# Patient Record
Sex: Female | Born: 1950 | Race: White | Hispanic: No | Marital: Married | Smoking: Former smoker
Health system: Southern US, Community
[De-identification: ages and names within clinical notes are randomized; demographics above are authoritative.]

## PROBLEM LIST (undated history)

## (undated) DIAGNOSIS — J449 Chronic obstructive pulmonary disease, unspecified: Secondary | ICD-10-CM

## (undated) DIAGNOSIS — Z720 Tobacco use: Secondary | ICD-10-CM

## (undated) DIAGNOSIS — Z889 Allergy status to unspecified drugs, medicaments and biological substances status: Secondary | ICD-10-CM

## (undated) DIAGNOSIS — Z801 Family history of malignant neoplasm of trachea, bronchus and lung: Secondary | ICD-10-CM

## (undated) DIAGNOSIS — Z86718 Personal history of other venous thrombosis and embolism: Secondary | ICD-10-CM

## (undated) DIAGNOSIS — Z87898 Personal history of other specified conditions: Secondary | ICD-10-CM

## (undated) DIAGNOSIS — N809 Endometriosis, unspecified: Secondary | ICD-10-CM

## (undated) HISTORY — PX: SKIN LESION EXCISION: SHX2412

## (undated) HISTORY — DX: Allergy status to unspecified drugs, medicaments and biological substances: Z88.9

## (undated) HISTORY — PX: TUBAL LIGATION: SHX77

## (undated) HISTORY — DX: Personal history of other specified conditions: Z87.898

## (undated) HISTORY — PX: TUMOR REMOVAL: SHX12

## (undated) HISTORY — DX: Personal history of other venous thrombosis and embolism: Z86.718

## (undated) HISTORY — DX: Chronic obstructive pulmonary disease, unspecified: J44.9

## (undated) HISTORY — DX: Tobacco use: Z72.0

## (undated) HISTORY — DX: Family history of malignant neoplasm of trachea, bronchus and lung: Z80.1

## (undated) HISTORY — PX: ABLATION ON ENDOMETRIOSIS: SHX5787

## (undated) HISTORY — DX: Endometriosis, unspecified: N80.9

---

## 1990-02-24 HISTORY — PX: TOTAL ABDOMINAL HYSTERECTOMY: SHX209

## 2001-05-23 ENCOUNTER — Other Ambulatory Visit: Admission: RE | Admit: 2001-05-23 | Discharge: 2001-05-23 | Payer: Self-pay | Admitting: Obstetrics and Gynecology

## 2001-06-10 ENCOUNTER — Encounter: Payer: Self-pay | Admitting: Obstetrics and Gynecology

## 2001-06-10 ENCOUNTER — Encounter: Admission: RE | Admit: 2001-06-10 | Discharge: 2001-06-10 | Payer: Self-pay | Admitting: Obstetrics and Gynecology

## 2002-06-15 ENCOUNTER — Other Ambulatory Visit: Admission: RE | Admit: 2002-06-15 | Discharge: 2002-06-15 | Payer: Self-pay | Admitting: Gynecology

## 2003-06-21 ENCOUNTER — Other Ambulatory Visit: Admission: RE | Admit: 2003-06-21 | Discharge: 2003-06-21 | Payer: Self-pay | Admitting: Gynecology

## 2004-06-21 ENCOUNTER — Other Ambulatory Visit: Admission: RE | Admit: 2004-06-21 | Discharge: 2004-06-21 | Payer: Self-pay | Admitting: Gynecology

## 2005-06-25 ENCOUNTER — Other Ambulatory Visit: Admission: RE | Admit: 2005-06-25 | Discharge: 2005-06-25 | Payer: Self-pay | Admitting: Gynecology

## 2006-08-07 ENCOUNTER — Other Ambulatory Visit: Admission: RE | Admit: 2006-08-07 | Discharge: 2006-08-07 | Payer: Self-pay | Admitting: Gynecology

## 2011-05-21 ENCOUNTER — Encounter: Payer: Self-pay | Admitting: Internal Medicine

## 2011-05-21 ENCOUNTER — Institutional Professional Consult (permissible substitution): Payer: Self-pay | Admitting: Internal Medicine

## 2011-05-22 ENCOUNTER — Encounter: Payer: Self-pay | Admitting: Internal Medicine

## 2011-05-22 ENCOUNTER — Ambulatory Visit (INDEPENDENT_AMBULATORY_CARE_PROVIDER_SITE_OTHER): Payer: BC Managed Care – PPO | Admitting: Internal Medicine

## 2011-05-22 VITALS — BP 116/68 | HR 70 | Temp 98.9°F | Ht 60.0 in | Wt 89.0 lb

## 2011-05-22 DIAGNOSIS — J449 Chronic obstructive pulmonary disease, unspecified: Secondary | ICD-10-CM | POA: Insufficient documentation

## 2011-05-22 NOTE — Progress Notes (Signed)
  Subjective:    Patient ID: Catherine Jensen, female    DOB: 19-Aug-1951, 60 y.o.   MRN: [id number]  HPI  52 yowf quit smoking 2010 due to wellness issues on work insurance with recurrent pattern of "bronchitis" going back to around 2007 and between feels pretty good x when tries to walk up hills but able to play golf all day. No change in pattern of "bronchitis" since quit smoking  05/22/2011 Initial pulmonary office eval cc recurrent cough and persistent minimally worsening  doe x hills only x 2 years. No present cough. Usually episodes occur 2-3 x per year "with the season changes, like when the heat in the house comes on".       Review of Systems  Constitutional: Negative for fever and unexpected weight change.  HENT: Positive for sore throat. Negative for ear pain, nosebleeds, congestion, rhinorrhea, sneezing, trouble swallowing, dental problem, postnasal drip and sinus pressure.   Eyes: Negative for redness and itching.  Respiratory: Positive for shortness of breath. Negative for cough, chest tightness and wheezing.   Cardiovascular: Negative for palpitations and leg swelling.  Gastrointestinal: Negative for nausea and vomiting.  Genitourinary: Negative for dysuria.  Musculoskeletal: Negative for joint swelling.  Skin: Negative for rash.  Neurological: Negative for headaches.  Hematological: Does not bruise/bleed easily.  Psychiatric/Behavioral: Positive for dysphoric mood. The patient is not nervous/anxious.        Objective:   Physical Exam    thin very anxious wf nad Wt 89 lb  05/22/2011  HEENT mild turbinate edema.  Oropharynx no thrush or excess pnd or cobblestoning.  No JVD or cervical adenopathy. Mild accessory muscle hypertrophy. Trachea midline, nl thryroid. Chest was hyperinflated by percussion with diminished breath sounds and moderate increased exp time without wheeze. Hoover sign positive at mid inspiration. Regular rate and rhythm without murmur gallop or rub or increase  P2 or edema.  Abd: no hsm, nl excursion. Ext warm without cyanosis or clubbing.      Assessment & Plan:

## 2011-05-22 NOTE — Patient Instructions (Addendum)
Maintaining off cigarettes is the best way to preserve your lung function and avoid conditions that bother your breathing  If you feel like you losing ground with your exercise we definitely need to see you in the [place] office with PFT's same day  I would recommend you receive you get a pneumonia shot now and at age 60 years and yearly influenza shot and a full set of PFT's (if you want any or all of these done we will need to see you in our [place] office,  Call [phone] to schedule a visit at your convenience)

## 2011-05-22 NOTE — Assessment & Plan Note (Signed)
Pattern clinically is of very mild bronchitis intermittently with no def chronic bronchitis or convincing symptoms of chronic airflow obstruction and she is not interested in any form of maint rx  I reviewed the Flethcher curve with patient that basically indicates  if you quit smoking when your best day FEV1 is still well preserved it is highly unlikely you will progress to severe disease and informed the patient there was no medication on the market that has proven to change the curve or the likelihood of progression.  Therefore stopping smoking and maintaining abstinence is the most important aspect of care, not choice of inhalers or for that matter, doctors.

## 2011-06-04 ENCOUNTER — Institutional Professional Consult (permissible substitution): Payer: BC Managed Care – PPO | Admitting: Internal Medicine

## 2011-06-21 ENCOUNTER — Institutional Professional Consult (permissible substitution): Payer: Self-pay | Admitting: Internal Medicine

## 2013-09-14 ENCOUNTER — Encounter (INDEPENDENT_AMBULATORY_CARE_PROVIDER_SITE_OTHER): Payer: Self-pay

## 2013-09-14 ENCOUNTER — Ambulatory Visit: Payer: BC Managed Care – PPO

## 2013-09-14 DIAGNOSIS — Z23 Encounter for immunization: Secondary | ICD-10-CM

## 2014-01-07 ENCOUNTER — Telehealth: Payer: Self-pay | Admitting: Family Medicine

## 2014-01-07 NOTE — Telephone Encounter (Signed)
Last vomited 30 min ago. Last episode of diarrhea was several hours ago. Symptoms began 2 days ago and have improved. She isn't sure if she has a fever.  Suggested she let her stomach rest for an hour. If no vomiting in an hour she can eat ice chips and progress to clear liquids slowly. No caffeine or dairy products. Immodium OTC in limited amounts. Emetrol OTC for nausea. Taken orally this may cause vomiting though. Patient is to go to the ED if her symptoms worsen during the night. She will need to be seen in our office tomorrow if symptoms do not improve. Patient stated understanding and agreement to plan.

## 2014-04-08 ENCOUNTER — Encounter: Payer: Self-pay | Admitting: Certified Nurse Midwife

## 2014-04-08 ENCOUNTER — Ambulatory Visit (INDEPENDENT_AMBULATORY_CARE_PROVIDER_SITE_OTHER): Payer: BC Managed Care – PPO | Admitting: Certified Nurse Midwife

## 2014-04-08 VITALS — BP 120/74 | HR 68 | Resp 16 | Ht 59.25 in | Wt 90.0 lb

## 2014-04-08 DIAGNOSIS — Z0000 Encounter for general adult medical examination without abnormal findings: Secondary | ICD-10-CM

## 2014-04-08 DIAGNOSIS — Z86718 Personal history of other venous thrombosis and embolism: Secondary | ICD-10-CM | POA: Insufficient documentation

## 2014-04-08 DIAGNOSIS — R319 Hematuria, unspecified: Secondary | ICD-10-CM

## 2014-04-08 DIAGNOSIS — Z01419 Encounter for gynecological examination (general) (routine) without abnormal findings: Secondary | ICD-10-CM

## 2014-04-08 LAB — POCT URINALYSIS DIPSTICK
Bilirubin, UA: NEGATIVE
Glucose, UA: NEGATIVE
Ketones, UA: NEGATIVE
Leukocytes, UA: NEGATIVE
Nitrite, UA: NEGATIVE
Protein, UA: NEGATIVE
Urobilinogen, UA: NEGATIVE
pH, UA: 5

## 2014-04-08 NOTE — Patient Instructions (Addendum)

## 2014-04-08 NOTE — Progress Notes (Signed)
63 y.o. G63P1001 Married Caucasian Fe here to establish gyn care and  for annual exam. Menopausal no HRT. Denies vaginal bleeding or vaginal dryness. 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 Sees PCP for aex yearly and labs with job yearly. Copy here, all normal. Denies any urinary symptoms or vaginal symptoms. Not sexually active in years. No other health issues today.  Patient's last menstrual period was 02/24/1990.          Sexually active: no  The current method of family planning is status post hysterectomy.    Exercising: yes  work,play golf, yardwork Smoker:  no  Health Maintenance: Pap:  2013 negative(vaginal) MMG:  2014 negative Colonoscopy:  2011 negative 10 years BMD:  2011, Osteopenia TDaP: unsure date with PCP Labs: Poct urine-rbc-tr Self breast exam: done monthly   reports that she quit smoking about 5 years ago. Her smoking use included Cigarettes. She has a 10.5 pack-year smoking history. She does not have any smokeless tobacco history on file. She reports that she does not drink alcohol or use illicit drugs.  Past Medical History  Diagnosis Date  . COPD (chronic obstructive pulmonary disease)   . FH: lung cancer   . Tobacco abuse   . Multiple allergies     pt states she took allergy shots x 20 yrs  . Endometriosis   . H/O blood clots     in legs    Past Surgical History  Procedure Laterality Date  . Total abdominal hysterectomy  1996  . Tumor removal    . Tubal ligation    . Skin lesion excision      Current Outpatient Prescriptions  Medication Sig Dispense Refill  . ASPIRIN PO Take by mouth. 2 daily      . BIOTIN PO Take by mouth daily.      . calcium citrate (CALCITRATE - DOSED IN MG ELEMENTAL CALCIUM) 950 MG tablet Take 1 tablet by mouth daily.        . Cholecalciferol (VITAMIN D) 1000 UNITS capsule Take 1,000  Units by mouth daily.        . Multiple Vitamins-Minerals (MULTIVITAMIN WITH MINERALS) tablet Take 1 tablet by mouth daily.         No current facility-administered medications for this visit.    Family History  Problem Relation Age of Onset  . Lung cancer Sister     w/ mets to brain  . Asthma Sister   . Crohn's disease Sister   . Heart disease Mother   . Diabetes Mother   . Heart attack Brother   . Diabetes Brother   . Kidney failure Brother     ROS:  Pertinent items are noted in HPI.  Otherwise, a comprehensive ROS was negative.  Exam:   BP 120/74  Pulse 68  Resp 16  Ht 4' 11.25" (1.505 m)  Wt 90 lb (40.824 kg)  BMI 18.02 kg/m2  LMP 02/24/1990 Height: 4' 11.25" (150.5 cm)  Ht Readings from Last 3 Encounters:  04/08/14 4' 11.25" (1.505 m)  05/22/11 5' (1.524 m)    General appearance: alert, cooperative and appears stated age Head: Normocephalic, without obvious abnormality, atraumatic Neck: no adenopathy, supple, symmetrical, trachea midline and thyroid normal to inspection and palpation and non-palpable Lungs: clear to auscultation bilaterally Breasts: normal appearance, no masses or tenderness, No nipple retraction or dimpling, No nipple  discharge or bleeding, No axillary or supraclavicular adenopathy Heart: regular rate and rhythm Abdomen: soft, non-tender; no masses,  no organomegaly Extremities: extremities normal, atraumatic, no cyanosis or edema Skin: Skin color, texture, turgor normal. No rashes or lesions Lymph nodes: Cervical, supraclavicular, and axillary nodes normal. No abnormal inguinal nodes palpated Neurologic: Grossly normal   Pelvic: External genitalia:  no lesions              Urethra:  normal appearing urethra with no masses, tenderness or lesions              Bartholin's and Skene's: normal                 Vagina: atrophic appearing vagina with normal color and discharge, no lesions, very small vaginal opening              Cervix: absent               Pap taken: no Bimanual Exam:  Uterus:  uterus absent              Adnexa: normal adnexa and no mass, fullness, tenderness               Rectovaginal: Confirms               Anus:  normal sphincter tone, no lesions  A:  Well Woman with normal exam  Menopausal no HRT 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 Patient plans to try Olive Oil, will advise if problems.  Pap smear not taken today  Continue follow up with PCP as indicated  Lab: Urine micro   counseled on breast self exam, mammography screening, osteoporosis, adequate intake of calcium and vitamin D, diet and exercise  return annually or prn  An After Visit Summary was printed and given to the patient.

## 2014-04-10 LAB — URINALYSIS, MICROSCOPIC ONLY
Bacteria, UA: NONE SEEN
Casts: NONE SEEN
Crystals: NONE SEEN
Squamous Epithelial / HPF: NONE SEEN

## 2014-04-12 NOTE — Progress Notes (Signed)
Reviewed personally.  M. Suzanne Lillar Bianca, MD.  

## 2014-09-14 ENCOUNTER — Ambulatory Visit (INDEPENDENT_AMBULATORY_CARE_PROVIDER_SITE_OTHER): Payer: BC Managed Care – PPO | Admitting: Family Medicine

## 2014-09-14 ENCOUNTER — Encounter (INDEPENDENT_AMBULATORY_CARE_PROVIDER_SITE_OTHER): Payer: Self-pay

## 2014-09-14 VITALS — BP 154/85 | HR 76 | Temp 98.0°F | Ht 59.0 in | Wt 91.6 lb

## 2014-09-14 DIAGNOSIS — J069 Acute upper respiratory infection, unspecified: Secondary | ICD-10-CM

## 2014-09-14 MED ORDER — AZITHROMYCIN 250 MG PO TABS: ORAL_TABLET | ORAL | Status: DC

## 2014-09-14 NOTE — Progress Notes (Signed)
   Subjective:    Patient ID: Catherine Jensen, female    DOB: February 09, 1951, 63 y.o.   MRN: [id number]  HPI This 63 y.o. female presents for evaluation of c/o uri sx's and cough.   Review of Systems No chest pain, SOB, HA, dizziness, vision change, N/V, diarrhea, constipation, dysuria, urinary urgency or frequency, myalgias, arthralgias or rash.     Objective:   Physical Exam  Vital signs noted  Well developed well nourished female.  HEENT - Head atraumatic Normocephalic                Eyes - PERRLA, Conjuctiva - clear Sclera- Clear EOMI                Ears - EAC's Wnl TM's Wnl Gross Hearing WN                Throat - oropharanx wnl Respiratory - Lungs CTA bilateral Cardiac - RRR S1 and S2 without murmur GI - Abdomen soft Nontender and bowel sounds active x 4 Extremities - No edema. Neuro - Grossly intact.      Assessment & Plan:  URI (upper respiratory infection) - Plan: azithromycin (ZITHROMAX) 250 MG tablet Push po fluids, rest, tylenol and motrin otc prn as directed for fever, arthralgias, and myalgias.  Follow up prn if sx's continue or persist.  Lysbeth Penner FNP

## 2014-09-27 ENCOUNTER — Encounter: Payer: Self-pay | Admitting: Certified Nurse Midwife

## 2015-04-11 ENCOUNTER — Encounter: Payer: Self-pay | Admitting: Certified Nurse Midwife

## 2015-04-11 ENCOUNTER — Ambulatory Visit (INDEPENDENT_AMBULATORY_CARE_PROVIDER_SITE_OTHER): Payer: BLUE CROSS/BLUE SHIELD | Admitting: Certified Nurse Midwife

## 2015-04-11 VITALS — BP 102/66 | HR 70 | Resp 16 | Ht 59.0 in | Wt 90.0 lb

## 2015-04-11 DIAGNOSIS — Z01419 Encounter for gynecological examination (general) (routine) without abnormal findings: Secondary | ICD-10-CM | POA: Diagnosis not present

## 2015-04-11 DIAGNOSIS — Z0000 Encounter for general adult medical examination without abnormal findings: Secondary | ICD-10-CM

## 2015-04-11 LAB — POCT URINALYSIS DIPSTICK
Bilirubin, UA: NEGATIVE
Blood, UA: NEGATIVE
Glucose, UA: NEGATIVE
Ketones, UA: NEGATIVE
Leukocytes, UA: NEGATIVE
Nitrite, UA: NEGATIVE
Protein, UA: NEGATIVE
Urobilinogen, UA: NEGATIVE
pH, UA: 5

## 2015-04-11 NOTE — Patient Instructions (Signed)

## 2015-04-11 NOTE — Progress Notes (Signed)
64 y.o. G5P1001 Married  Caucasian Fe here for annual exam. Menopausal no HRT. Occasional hot flash, no issues. Vaginal dryness, but not sexually active. Does feel she needs moisturizer due to not being sexually active. Sees PCP once yearly and labs with work, brought lab copy with her, all normal.  Due for colonoscopy, patient will schedule. No other health issues today.  Patient's last menstrual period was 02/24/1990.          Sexually active: No.  The current method of family planning is status post hysterectomy.    Exercising: Yes.    play golf Smoker:  no  Health Maintenance: Pap:  2013 neg (vaginal) MMG:  2015 neg per patient Colonoscopy:  2011 neg f/u 21yr BMD:   2011, osteopenia TDaP:  Unsure with pcp feels she is update Labs: Poct urine-neg Self breast exam: done monthly   reports that she quit smoking about 6 years ago. Her smoking use included Cigarettes. She has a 10.5 pack-year smoking history. She has never used smokeless tobacco. She reports that she does not drink alcohol or use illicit drugs.  Past Medical History  Diagnosis Date  . COPD (chronic obstructive pulmonary disease)   . FH: lung cancer   . Tobacco abuse   . Multiple allergies     pt states she took allergy shots x 20 yrs  . Endometriosis   . H/O blood clots     in legs    Past Surgical History  Procedure Laterality Date  . Tumor removal    . Tubal ligation    . Skin lesion excision    . Total abdominal hysterectomy  4/91    Current Outpatient Prescriptions  Medication Sig Dispense Refill  . ASPIRIN PO Take by mouth. 2 daily    . BIOTIN PO Take by mouth daily.    . calcium citrate (CALCITRATE - DOSED IN MG ELEMENTAL CALCIUM) 950 MG tablet Take 1 tablet by mouth daily.      . Cholecalciferol (VITAMIN D) 1000 UNITS capsule Take 1,000 Units by mouth daily.      . Multiple Vitamins-Minerals (MULTIVITAMIN WITH MINERALS) tablet Take 1 tablet by mouth daily.       No current facility-administered  medications for this visit.    Family History  Problem Relation Age of Onset  . Lung cancer Sister     w/ mets to brain  . Asthma Sister   . Crohn's disease Sister   . Heart disease Mother   . Diabetes Mother   . Heart attack Brother   . Diabetes Brother   . Kidney failure Brother     ROS:  Pertinent items are noted in HPI.  Otherwise, a comprehensive ROS was negative.  Exam:   BP 102/66 mmHg  Pulse 70  Resp 16  Ht '4\' 11"'$  (1.499 m)  Wt 90 lb (40.824 kg)  BMI 18.17 kg/m2  LMP 02/24/1990 Height: '4\' 11"'$  (149.9 cm) Ht Readings from Last 3 Encounters:  04/11/15 '4\' 11"'$  (1.499 m)  09/14/14 '4\' 11"'$  (1.499 m)  04/08/14 4' 11.25" (1.505 m)    General appearance: alert, cooperative and appears stated age Head: Normocephalic, without obvious abnormality, atraumatic Neck: no adenopathy, supple, symmetrical, trachea midline and thyroid normal to inspection and palpation Lungs: clear to auscultation bilaterally Breasts: normal appearance, no masses or tenderness, No nipple retraction or dimpling, No nipple discharge or bleeding, No axillary or supraclavicular adenopathy Heart: regular rate and rhythm Abdomen: soft, non-tender; no masses,  no organomegaly Extremities: extremities  normal, atraumatic, no cyanosis or edema Skin: Skin color, texture, turgor normal. No rashes or lesions Lymph nodes: Cervical, supraclavicular, and axillary nodes normal. No abnormal inguinal nodes palpated Neurologic: Grossly normal   Pelvic: External genitalia:  no lesions              Urethra:  normal appearing urethra with no masses, tenderness or lesions              Bartholin's and Skene's: normal                 Vagina: normal appearing vagina with normal color and discharge, no lesions              Cervix: absent              Pap taken: No. Bimanual Exam:  Uterus:  uterus absent              Adnexa: normal adnexa and no mass, fullness, tenderness               Rectovaginal: Confirms                Anus:  normal sphincter tone, no lesions  Chaperone present: Yes  A:  Well Woman with normal exam  Menopausal no HRT S/P TAH with ovaries for benign uterine tumor  Osteopenia due for BMD  Atrophic Vaginitis, desires no medication or OTC products  P:   Reviewed health and wellness pertinent to exam  Discussed increase risk of UTI with vaginal dryness and encouraged Coconut oil use, patient will consider  Patient will schedule with mammogram and given information to have sent here from [facility]  Patient will schedule colonoscopy in the summer has recall information  Pap smear not taken today   counseled on breast self exam, mammography screening, adequate intake of calcium and vitamin D, diet and exercise  return annually or prn  An After Visit Summary was printed and given to the patient.

## 2015-04-12 NOTE — Progress Notes (Signed)
Reviewed personally.  M. Suzanne Roshawnda Pecora, MD.  

## 2015-10-13 ENCOUNTER — Telehealth: Payer: Self-pay | Admitting: Family Medicine

## 2016-04-18 ENCOUNTER — Ambulatory Visit (INDEPENDENT_AMBULATORY_CARE_PROVIDER_SITE_OTHER): Payer: BLUE CROSS/BLUE SHIELD | Admitting: Certified Nurse Midwife

## 2016-04-18 ENCOUNTER — Encounter: Payer: Self-pay | Admitting: Certified Nurse Midwife

## 2016-04-18 VITALS — BP 100/64 | HR 70 | Resp 16 | Ht 58.75 in | Wt 94.0 lb

## 2016-04-18 DIAGNOSIS — M8580 Other specified disorders of bone density and structure, unspecified site: Secondary | ICD-10-CM

## 2016-04-18 DIAGNOSIS — Z0000 Encounter for general adult medical examination without abnormal findings: Secondary | ICD-10-CM | POA: Diagnosis not present

## 2016-04-18 DIAGNOSIS — Z23 Encounter for immunization: Secondary | ICD-10-CM | POA: Diagnosis not present

## 2016-04-18 DIAGNOSIS — Z01419 Encounter for gynecological examination (general) (routine) without abnormal findings: Secondary | ICD-10-CM

## 2016-04-18 LAB — POCT URINALYSIS DIPSTICK
Bilirubin, UA: NEGATIVE
Blood, UA: NEGATIVE
Glucose, UA: NEGATIVE
Ketones, UA: NEGATIVE
Leukocytes, UA: NEGATIVE
Nitrite, UA: NEGATIVE
Protein, UA: NEGATIVE
Urobilinogen, UA: NEGATIVE
pH, UA: 5

## 2016-04-18 LAB — HIV ANTIBODY (ROUTINE TESTING W REFLEX): HIV 1&2 Ab, 4th Generation: NONREACTIVE

## 2016-04-18 NOTE — Patient Instructions (Signed)
EXERCISE AND DIET:  We recommended that you start or continue a regular exercise program for good health. Regular exercise means any activity that makes your heart beat faster and makes you sweat.  We recommend exercising at least 30 minutes per day at least 3 days a week, preferably 4 or 5.  We also recommend a diet low in fat and sugar.  Inactivity, poor dietary choices and obesity can cause diabetes, heart attack, stroke, and kidney damage, among others.    ALCOHOL AND SMOKING:  Women should limit their alcohol intake to no more than 7 drinks/beers/glasses of wine (combined, not each!) per week. Moderation of alcohol intake to this level decreases your risk of breast cancer and liver damage. And of course, no recreational drugs are part of a healthy lifestyle.  And absolutely no smoking or even second hand smoke. Most people know smoking can cause heart and lung diseases, but did you know it also contributes to weakening of your bones? Aging of your skin?  Yellowing of your teeth and nails?  CALCIUM AND VITAMIN D:  Adequate intake of calcium and Vitamin D are recommended.  The recommendations for exact amounts of these supplements seem to change often, but generally speaking 600 mg of calcium (either carbonate or citrate) and 800 units of Vitamin D per day seems prudent. Certain women may benefit from higher intake of Vitamin D.  If you are among these women, your doctor will have told you during your visit.    PAP SMEARS:  Pap smears, to check for cervical cancer or precancers,  have traditionally been done yearly, although recent scientific advances have shown that most women can have pap smears less often.  However, every woman still should have a physical exam from her gynecologist every year. It will include a breast check, inspection of the vulva and vagina to check for abnormal growths or skin changes, a visual exam of the cervix, and then an exam to evaluate the size and shape of the uterus and  ovaries.  And after 64 years of age, a rectal exam is indicated to check for rectal cancers. We will also provide age appropriate advice regarding health maintenance, like when you should have certain vaccines, screening for sexually transmitted diseases, bone density testing, colonoscopy, mammograms, etc.   MAMMOGRAMS:  All women over 40 years old should have a yearly mammogram. Many facilities now offer a "3D" mammogram, which may cost around $50 extra out of pocket. If possible,  we recommend you accept the option to have the 3D mammogram performed.  It both reduces the number of women who will be called back for extra views which then turn out to be normal, and it is better than the routine mammogram at detecting truly abnormal areas.    COLONOSCOPY:  Colonoscopy to screen for colon cancer is recommended for all women at age 50.  We know, you hate the idea of the prep.  We agree, BUT, having colon cancer and not knowing it is worse!!  Colon cancer so often starts as a polyp that can be seen and removed at colonscopy, which can quite literally save your life!  And if your first colonoscopy is normal and you have no family history of colon cancer, most women don't have to have it again for 10 years.  Once every ten years, you can do something that may end up saving your life, right?  We will be happy to help you get it scheduled when you are ready.    Be sure to check your insurance coverage so you understand how much it will cost.  It may be covered as a preventative service at no cost, but you should check your particular policy.     Tdap Vaccine (Tetanus, Diphtheria and Pertussis): What You Need to Know 1. Why get vaccinated? Tetanus, diphtheria and pertussis are very serious diseases. Tdap vaccine can protect [place] from these diseases. And, Tdap vaccine given to pregnant women can protect newborn babies against pertussis. TETANUS (Lockjaw) is rare in the [place] today. It causes painful muscle  tightening and stiffness, usually all over the body.  It can lead to tightening of muscles in the head and neck so you can't open your mouth, swallow, or sometimes even breathe. Tetanus kills about 1 out of 10 people who are infected even after receiving the best medical care. DIPHTHERIA is also rare in the [place] today. It can cause a thick coating to form in the back of the throat.  It can lead to breathing problems, heart failure, paralysis, and death. PERTUSSIS (Whooping Cough) causes severe coughing spells, which can cause difficulty breathing, vomiting and disturbed sleep.  It can also lead to weight loss, incontinence, and rib fractures. Up to 2 in 100 adolescents and 5 in 100 adults with pertussis are hospitalized or have complications, which could include pneumonia or death. These diseases are caused by bacteria. Diphtheria and pertussis are spread from person to person through secretions from coughing or sneezing. Tetanus enters the body through cuts, scratches, or wounds. Before vaccines, as many as 200,000 cases of diphtheria, 200,000 cases of pertussis, and hundreds of cases of tetanus, were reported in the [place] each year. Since vaccination began, reports of cases for tetanus and diphtheria have dropped by about 99% and for pertussis by about 80%. 2. Tdap vaccine Tdap vaccine can protect adolescents and adults from tetanus, diphtheria, and pertussis. One dose of Tdap is routinely given at age 52 or 92. People who did not get Tdap at that age should get it as soon as possible. Tdap is especially important for healthcare professionals and anyone having close contact with a baby younger than 12 months. Pregnant women should get a dose of Tdap during every pregnancy, to protect the newborn from pertussis. Infants are most at risk for severe, life-threatening complications from pertussis. Another vaccine, called Td, protects against tetanus and diphtheria, but not pertussis. A  Td booster should be given every 10 years. Tdap may be given as one of these boosters if you have never gotten Tdap before. Tdap may also be given after a severe cut or burn to prevent tetanus infection. Your doctor or the person giving you the vaccine can give you more information. Tdap may safely be given at the same time as other vaccines. 3. Some people should not get this vaccine  A person who has ever had a life-threatening allergic reaction after a previous dose of any diphtheria, tetanus or pertussis containing vaccine, OR has a severe allergy to any part of this vaccine, should not get Tdap vaccine. Tell the person giving the vaccine about any severe allergies.  Anyone who had coma or long repeated seizures within 7 days after a childhood dose of DTP or DTaP, or a previous dose of Tdap, should not get Tdap, unless a cause other than the vaccine was found. They can still get Td.  Talk to your doctor if you:  have seizures or another nervous system problem,  had severe pain  or swelling after any vaccine containing diphtheria, tetanus or pertussis,  ever had a condition called Guillain-Barr Syndrome (GBS),  aren't feeling well on the day the shot is scheduled. 4. Risks With any medicine, including vaccines, there is a chance of side effects. These are usually mild and go away on their own. Serious reactions are also possible but are rare. Most people who get Tdap vaccine do not have any problems with it. Mild problems following Tdap (Did not interfere with activities)  Pain where the shot was given (about 3 in 4 adolescents or 2 in 3 adults)  Redness or swelling where the shot was given (about 1 person in 5)  Mild fever of at least 100.59F (up to about 1 in 25 adolescents or 1 in 100 adults)  Headache (about 3 or 4 people in 10)  Tiredness (about 1 person in 3 or 4)  Nausea, vomiting, diarrhea, stomach ache (up to 1 in 4 adolescents or 1 in 10 adults)  Chills, sore joints  (about 1 person in 10)  Body aches (about 1 person in 3 or 4)  Rash, swollen glands (uncommon) Moderate problems following Tdap (Interfered with activities, but did not require medical attention)  Pain where the shot was given (up to 1 in 5 or 6)  Redness or swelling where the shot was given (up to about 1 in 16 adolescents or 1 in 12 adults)  Fever over 102F (about 1 in 100 adolescents or 1 in 250 adults)  Headache (about 1 in 7 adolescents or 1 in 10 adults)  Nausea, vomiting, diarrhea, stomach ache (up to 1 or 3 people in 100)  Swelling of the entire arm where the shot was given (up to about 1 in 500). Severe problems following Tdap (Unable to perform usual activities; required medical attention)  Swelling, severe pain, bleeding and redness in the arm where the shot was given (rare). Problems that could happen after any vaccine:  People sometimes faint after a medical procedure, including vaccination. Sitting or lying down for about 15 minutes can help prevent fainting, and injuries caused by a fall. Tell your doctor if you feel dizzy, or have vision changes or ringing in the ears.  Some people get severe pain in the shoulder and have difficulty moving the arm where a shot was given. This happens very rarely.  Any medication can cause a severe allergic reaction. Such reactions from a vaccine are very rare, estimated at fewer than 1 in a million doses, and would happen within a few minutes to a few hours after the vaccination. As with any medicine, there is a very remote chance of a vaccine causing a serious injury or death. The safety of vaccines is always being monitored. For more information, visit: http://www.aguilar.org/ 5. What if there is a serious problem? What should I look for?  Look for anything that concerns you, such as signs of a severe allergic reaction, very high fever, or unusual behavior.  Signs of a severe allergic reaction can include hives, swelling of  the face and throat, difficulty breathing, a fast heartbeat, dizziness, and weakness. These would usually start a few minutes to a few hours after the vaccination. What should I do?  If you think it is a severe allergic reaction or other emergency that can't wait, call 9-1-1 or get the person to the nearest hospital. Otherwise, call your doctor.  Afterward, the reaction should be reported to the Vaccine Adverse Event Reporting System (VAERS). Your doctor might file  this report, or you can do it yourself through the VAERS web site at www.vaers.SamedayNews.es, or by calling (630)468-1170. VAERS does not give medical advice.  6. The National Vaccine Injury Compensation Program The [employer] Vaccine Injury Compensation Program (VICP) is a federal program that was created to compensate people who may have been injured by certain vaccines. Persons who believe they may have been injured by a vaccine can learn about the program and about filing a claim by calling 8386693543 or visiting the [place] website at GoldCloset.com.ee. There is a time limit to file a claim for compensation. 7. How can I learn more?  Ask your doctor. He or she can give you the vaccine package insert or suggest other sources of information.  Call your local or state health department.  Contact the Centers for Disease Control and Prevention (CDC):  Call 705-100-8083 (1-800-CDC-INFO) or  Visit CDC's website at http://hunter.com/ CDC Tdap Vaccine VIS (01/19/14)   This information is not intended to replace advice given to you by your health care provider. Make sure you discuss any questions you have with your health care provider.   Document Released: 05/13/2012 Document Revised: 12/03/2014 Document Reviewed: 02/24/2014 Elsevier Interactive Patient Education [employer].

## 2016-04-18 NOTE — Progress Notes (Addendum)
65 y.o. [id number] Married  Caucasian Fe here for annual exam. Menopausal no HRT.Denies vaginal bleeding. Vaginal dryness responding well coconut oil. Spouse had stroke the first of May. Spouse better and walking now.  Sees [facility] if needed. Labs at work all normal. She brought copy with her.  Patient taking calcium and Vitamin D.  No health issues today.   Patient's last menstrual period was 02/24/1990.          Sexually active: No.  The current method of family planning is status post hysterectomy.    Exercising: No.  exercise Smoker:  no  Health Maintenance: Pap:  2013 neg (vaginal) MMG: 02-16-16 category c density,birads 1:neg Colonoscopy:  2011 neg f/y 70yr BMD:   2011, osteopenia 06/20/16 Spine -0.5, Left hip -1.6,right -2.1 Osteopenia TDaP: unsure Shingles :no  Pneumonia: 2014 Hep C and HIV: not done Labs: poct urine-neg Self breast exam: done monthly   reports that she quit smoking about 7 years ago. Her smoking use included Cigarettes. She has a 10.5 pack-year smoking history. She has never used smokeless tobacco. She reports that she does not drink alcohol or use illicit drugs.  Past Medical History  Diagnosis Date  . COPD (chronic obstructive pulmonary disease) ([place]   . FH: lung cancer   . Tobacco abuse   . Multiple allergies     pt states she took allergy shots x 20 yrs  . Endometriosis   . H/O blood clots     in legs    Past Surgical History  Procedure Laterality Date  . Tumor removal    . Tubal ligation    . Skin lesion excision    . Total abdominal hysterectomy  4/91    Current Outpatient Prescriptions  Medication Sig Dispense Refill  . ASPIRIN PO Take by mouth. 2 daily    . BIOTIN PO Take by mouth daily.    . calcium citrate (CALCITRATE - DOSED IN MG ELEMENTAL CALCIUM) 950 MG tablet Take 1 tablet by mouth daily.      . Cholecalciferol (VITAMIN D) 1000 UNITS capsule Take 1,000 Units by mouth daily.      . Multiple Vitamins-Minerals  (MULTIVITAMIN WITH MINERALS) tablet Take 1 tablet by mouth daily.       No current facility-administered medications for this visit.    Family History  Problem Relation Age of Onset  . Lung cancer Sister     w/ mets to brain  . Asthma Sister   . Crohn's disease Sister   . Heart disease Mother   . Diabetes Mother   . Heart attack Brother   . Diabetes Brother   . Kidney failure Brother     ROS:  Pertinent items are noted in HPI.  Otherwise, a comprehensive ROS was negative.  Exam:   BP 100/64 mmHg  Pulse 70  Resp 16  Ht 4' 10.75" (1.492 m)  Wt 94 lb (42.638 kg)  BMI 19.15 kg/m2  LMP 02/24/1990 Height: 4' 10.75" (149.2 cm) Ht Readings from Last 3 Encounters:  04/18/16 4' 10.75" (1.492 m)  04/11/15 '4\' 11"'$  (1.499 m)  09/14/14 '4\' 11"'$  (1.499 m)    General appearance: alert, cooperative and appears stated age Head: Normocephalic, without obvious abnormality, atraumatic Neck: no adenopathy, supple, symmetrical, trachea midline and thyroid normal to inspection and palpation Lungs: clear to auscultation bilaterally Breasts: normal appearance, no masses or tenderness, No nipple retraction or dimpling, No nipple discharge or bleeding, No axillary or supraclavicular adenopathy Heart: regular rate and  rhythm Abdomen: soft, non-tender; no masses,  no organomegaly Extremities: extremities normal, atraumatic, no cyanosis or edema Skin: Skin color, texture, turgor normal. No rashes or lesions Lymph nodes: Cervical, supraclavicular, and axillary nodes normal. No abnormal inguinal nodes palpated Neurologic: Grossly normal   Pelvic: External genitalia:  no lesions              Urethra:  normal appearing urethra with no masses, tenderness or lesions              Bartholin's and Skene's: normal                 Vagina: atrophic appearing vagina with normal color and discharge, no lesions              Cervix: absent              Pap taken: No. Bimanual Exam:  Uterus:  uterus absent               Adnexa: no mass, fullness, tenderness               Rectovaginal: Confirms               Anus:  normal sphincter tone, no lesions  Chaperone present: yes  A:  Well Woman with normal exam  Menopausal no HRT  Atrophic vaginitis  Osteopenia BMD due  Screening labs  Immunization due  P:   Reviewed health and wellness pertinent to exam  Aware of need to evaluate if vaginal bleeding  Continue coconut oil use as discussed, advise if problems  Will schedule for patient  Labs:Vitamin D, Hep C HIV  Request TDAP  Pap smear as above not taken   counseled on breast self exam, mammography screening, adequate intake of calcium and vitamin D, diet and exercise  return annually or prn  An After Visit Summary was printed and given to the patient.  Patient received Tdap at office visit. Injection was administered in right deltoid. Patient tolerated injection well.

## 2016-04-19 ENCOUNTER — Telehealth: Payer: Self-pay

## 2016-04-19 LAB — HEPATITIS C ANTIBODY: HCV Ab: NEGATIVE

## 2016-04-19 LAB — VITAMIN D 25 HYDROXY (VIT D DEFICIENCY, FRACTURES): Vit D, 25-Hydroxy: 92 ng/mL (ref 30–100)

## 2016-04-19 NOTE — Telephone Encounter (Signed)
lmtcb

## 2016-04-19 NOTE — Telephone Encounter (Signed)
-----   Message from Regina Eck, CNM sent at 04/19/2016  3:13 AM EDT ----- Notify Hep C and HIV are negative Vitamin D is normal

## 2016-04-19 NOTE — Progress Notes (Signed)
Reviewed personally.  M. Suzanne Saraiya Kozma, MD.  

## 2016-04-19 NOTE — Telephone Encounter (Signed)
Left message for call back.

## 2016-04-19 NOTE — Telephone Encounter (Signed)
Patient notified of results. See lab 

## 2016-04-25 ENCOUNTER — Encounter: Payer: Self-pay | Admitting: Certified Nurse Midwife

## 2016-06-27 ENCOUNTER — Telehealth: Payer: Self-pay

## 2016-06-27 NOTE — Telephone Encounter (Signed)
Called patient to give her bmd results.  Lmtcb

## 2016-06-28 NOTE — Telephone Encounter (Signed)
Patient notified of results. See lab 

## 2016-11-23 ENCOUNTER — Ambulatory Visit (INDEPENDENT_AMBULATORY_CARE_PROVIDER_SITE_OTHER): Payer: BLUE CROSS/BLUE SHIELD | Admitting: Family

## 2016-11-23 ENCOUNTER — Encounter: Payer: Self-pay | Admitting: Family

## 2016-11-23 VITALS — BP 131/78 | HR 87 | Temp 97.9°F | Ht 58.75 in | Wt 94.6 lb

## 2016-11-23 DIAGNOSIS — J209 Acute bronchitis, unspecified: Secondary | ICD-10-CM | POA: Diagnosis not present

## 2016-11-23 MED ORDER — AZITHROMYCIN 250 MG PO TABS: ORAL_TABLET | ORAL | 0 refills | Status: DC

## 2016-11-23 MED ORDER — ALBUTEROL SULFATE HFA 108 (90 BASE) MCG/ACT IN AERS: 2.0000 | INHALATION_SPRAY | Freq: Four times a day (QID) | RESPIRATORY_TRACT | 2 refills | Status: DC | PRN

## 2016-11-23 NOTE — Patient Instructions (Signed)

## 2016-11-23 NOTE — Progress Notes (Signed)
   Subjective:    Patient ID: Catherine Jensen, female    DOB: 1951-05-16, 65 y.o.   MRN: [id number]  Cough  This is a new problem. The current episode started in the past 7 days. The problem has been gradually worsening. The problem occurs every few minutes. The cough is productive of sputum. Associated symptoms include chills, myalgias, nasal congestion, postnasal drip, rhinorrhea, a sore throat and shortness of breath. Pertinent negatives include no ear congestion, ear pain, fever, headaches or wheezing. The symptoms are aggravated by lying down. She has tried OTC cough suppressant and rest for the symptoms. The treatment provided mild relief. There is no history of asthma or COPD.      Review of Systems  Constitutional: Positive for chills. Negative for fever.  HENT: Positive for postnasal drip, rhinorrhea and sore throat. Negative for ear pain.   Respiratory: Positive for cough and shortness of breath. Negative for wheezing.   Musculoskeletal: Positive for myalgias.  Neurological: Negative for headaches.  All other systems reviewed and are negative.      Objective:   Physical Exam  Constitutional: She is oriented to person, place, and time. She appears well-developed and well-nourished. No distress.  HENT:  Head: Normocephalic and atraumatic.  Right Ear: External ear normal.  Left Ear: External ear normal.  Nose: Mucosal edema and rhinorrhea present.  Mouth/Throat: Posterior oropharyngeal erythema present.  Eyes: Pupils are equal, round, and reactive to light.  Neck: Normal range of motion. Neck supple. No thyromegaly present.  Cardiovascular: Normal rate, regular rhythm, normal heart sounds and intact distal pulses.   No murmur heard. Pulmonary/Chest: Effort normal. No respiratory distress. She has decreased breath sounds. She has wheezes.  Abdominal: Soft. Bowel sounds are normal. She exhibits no distension. There is no tenderness.  Musculoskeletal: Normal range of motion. She  exhibits no edema or tenderness.  Neurological: She is alert and oriented to person, place, and time.  Skin: Skin is warm and dry.  Psychiatric: She has a normal mood and affect. Her behavior is normal. Judgment and thought content normal.  Vitals reviewed.     Temp 97.9 F (36.6 C) (Oral)   Ht 4' 10.75" (1.492 m)   Wt 94 lb 9.6 oz (42.9 kg)   LMP 02/24/1990   BMI 19.27 kg/m      Assessment & Plan:  1. Acute bronchitis, unspecified organism - Take meds as prescribed - Use a cool mist humidifier  -Use saline nose sprays frequently -Saline irrigations of the nose can be very helpful if done frequently.  * 4X daily for 1 week*  * Use of a nettie pot can be helpful with this. Follow directions with this* -Force fluids -For any cough or congestion  Use plain Mucinex- regular strength or max strength is fine   * Children- consult with Pharmacist for dosing -For fever or aces or pains- take tylenol or ibuprofen appropriate for age and weight.  * for fevers greater than 101 orally you may alternate ibuprofen and tylenol every  3 hours. -Throat lozenges if help -New toothbrush in 3 days - azithromycin (ZITHROMAX) 250 MG tablet; Take 500 mg once, then 250 mg for four days  Dispense: 6 tablet; Refill: 0 - albuterol (PROVENTIL HFA;VENTOLIN HFA) 108 (90 Base) MCG/ACT inhaler; Inhale 2 puffs into the lungs every 6 (six) hours as needed for wheezing or shortness of breath.  Dispense: 1 Inhaler; Refill: Desha, FNP

## 2017-01-09 ENCOUNTER — Ambulatory Visit (INDEPENDENT_AMBULATORY_CARE_PROVIDER_SITE_OTHER): Payer: Medicare Other | Admitting: *Deleted

## 2017-01-09 VITALS — BP 129/67 | HR 69 | Temp 97.6°F | Ht 58.75 in | Wt 95.0 lb

## 2017-01-09 DIAGNOSIS — Z0000 Encounter for general adult medical examination without abnormal findings: Secondary | ICD-10-CM | POA: Diagnosis not present

## 2017-01-09 DIAGNOSIS — Z23 Encounter for immunization: Secondary | ICD-10-CM | POA: Diagnosis not present

## 2017-01-09 NOTE — Progress Notes (Signed)
Subjective:   Catherine Jensen is a 65 y.o. female who presents for Medicare Annual (Subsequent) preventive examination  Patient here today for annual medicare wellness visit. She is retired from [employer] here in [place]. She enjoys playing golf about 3 times a week and yard work in her free time. She states that she generally only eats about 2 healthy meals a day. She lives at home with her husband. She has one child of her own and 2 step children, with 7 grandchildren total. She does not have any pets. Her husband had a stroke awhile back and they are very peculiar about fall hazards in the house. She states that compared to a year ago, her health is about the same.        Objective:     Vitals: BP 129/67 (BP Location: Right Arm)   Pulse 69   Temp 97.6 F (36.4 C) (Oral)   Ht 4' 10.75" (1.492 m)   Wt 95 lb (43.1 kg)   LMP 02/24/1990   BMI 19.35 kg/m   Body mass index is 19.35 kg/m.   Tobacco History  Smoking Status  . Former Smoker  . Packs/day: 0.50  . Years: 21.00  . Types: Cigarettes  . Quit date: 11/26/2008  Smokeless Tobacco  . Never Used     Counseling given: Not Answered she was not counseled on tobacco, as she has already quit.   Past Medical History:  Diagnosis Date  . COPD (chronic obstructive pulmonary disease) ([place])   . Endometriosis   . FH: lung cancer   . H/O blood clots    in legs  . Multiple allergies    pt states she took allergy shots x 20 yrs  . Tobacco abuse    Past Surgical History:  Procedure Laterality Date  . SKIN LESION EXCISION    . TOTAL ABDOMINAL HYSTERECTOMY  4/91  . TUBAL LIGATION    . TUMOR REMOVAL     Family History  Problem Relation Age of Onset  . Heart attack Brother   . Diabetes Brother   . Kidney failure Brother     91  . Lung cancer Sister     w/ mets to brain  . Asthma Sister   . Crohn's disease Sister   . Cancer Sister     lung cancer  . Heart disease Mother   . Diabetes Mother    History  Sexual Activity  .  Sexual activity: No    Comment: hysterectomy    Outpatient Encounter Prescriptions as of 01/09/2017  Medication Sig  . ASPIRIN PO Take by mouth. 2 daily  . BIOTIN PO Take by mouth daily.  . calcium citrate (CALCITRATE - DOSED IN MG ELEMENTAL CALCIUM) 950 MG tablet Take 1 tablet by mouth daily.    . Cholecalciferol (VITAMIN D) 1000 UNITS capsule Take 1,000 Units by mouth daily.    . Multiple Vitamins-Minerals (MULTIVITAMIN WITH MINERALS) tablet Take 1 tablet by mouth daily.    . [DISCONTINUED] azithromycin (ZITHROMAX) 250 MG tablet Take 500 mg once, then 250 mg for four days  . [DISCONTINUED] albuterol (PROVENTIL HFA;VENTOLIN HFA) 108 (90 Base) MCG/ACT inhaler Inhale 2 puffs into the lungs every 6 (six) hours as needed for wheezing or shortness of breath.   No facility-administered encounter medications on file as of 01/09/2017.     Activities of Daily Living In your present state of health, do you have any difficulty performing the following activities: 01/09/2017  Hearing? N  Vision? Darreld Mclean  Difficulty concentrating or making decisions? N  Walking or climbing stairs? N  Dressing or bathing? N  Doing errands, shopping? N  Some recent data might be hidden  she wears reading glasses only   Patient Care Team: Eustaquio Maize, MD as PCP - General (Pediatrics) Selinda Orion, MD (Inactive) as Referring Physician (Obstetrics and Gynecology)   she also sees Dr Katy Fitch for vision checks every 2 years and she sees a PA for her GYN visits yearly.  Assessment:    Exercise Activities and Dietary recommendations   She enjoys playing golf and yard work for a hobby and exercise.  Goals    . Exercise 3x per week (30 min per time)    . Have 3 meals a day      Fall Risk Fall Risk  01/09/2017 11/23/2016 09/14/2014  Falls in the past year? No No No   Depression Screen PHQ 2/9 Scores 01/09/2017 11/23/2016 09/14/2014  PHQ - 2 Score 0 0 1     Cognitive Function MMSE - Mini Mental State Exam  01/09/2017  Orientation to time 5  Orientation to Place 5  Registration 3  Attention/ Calculation 5  Recall 3  Language- name 2 objects 2  Language- repeat 1  Language- follow 3 step command 3  Language- read & follow direction 1  Write a sentence 1  Copy design 1  Total score 30    MMSE score was 30 out of 30.     Immunization History  Administered Date(s) Administered  . Influenza,inj,Quad PF,36+ Mos 09/14/2013, 01/09/2017  . Pneumococcal Conjugate-13 01/09/2017  . Pneumococcal-Unspecified 04/08/2013  . Tdap 04/18/2016   Screening Tests Health Maintenance  Topic Date Due  . COLONOSCOPY  01/07/2002  . PNA vac Low Risk Adult (1 of 2 - PCV13) 01/07/2017  . ZOSTAVAX  10/09/2017 (Originally 01/08/2012)  . MAMMOGRAM  02/15/2018  . PAP SMEAR  03/27/2019  . TETANUS/TDAP  04/18/2026  . INFLUENZA VACCINE  Completed  . DEXA SCAN  Completed  . Hepatitis C Screening  Completed  . HIV Screening  Completed      Plan:    During the course of the visit the patient was educated and counseled about the following appropriate screening and preventive services:   Vaccines to include Pneumoccal, Influenza, Hepatitis B, Td, Zostavax, HCV  Electrocardiogram  Cardiovascular Disease  Colorectal cancer screening  Bone density screening  Diabetes screening  Glaucoma screening  Mammography/PAP  Nutrition counseling   Pt is to follow up with [name], NP at least yearly and keep follow up appointments with her specialist as well.  She will need a "adult" pneumovax 23 in one year.   Patient Instructions (the written plan) was given to the patient.   Veronica Guerrant, Cameron Proud, LPN  3/81/8299   I have reviewed and agree with the above AWV documentation.   Caryl Pina, MD [facility] 01/09/2017, 11:42 AM

## 2017-01-09 NOTE — Patient Instructions (Signed)
  Ms. Catherine Jensen , Thank you for taking time to come for your Medicare Wellness Visit. I appreciate your ongoing commitment to your health goals. Please review the following plan we discussed and let me know if I can assist you in the future.   These are the goals we discussed: Goals    . Exercise 3x per week (30 min per time)    . Have 3 meals a day       This is a list of the screening recommended for you and due dates:  Health Maintenance  Topic Date Due  . Colon Cancer Screening  01/07/2002  . Pneumonia vaccines (1 of 2 - PCV13) 01/07/2017  . Shingles Vaccine  10/09/2017*  . Mammogram  02/15/2018  . Pap Smear  03/27/2019  . Tetanus Vaccine  04/18/2026  . Flu Shot  Completed  . DEXA scan (bone density measurement)  Completed  .  Hepatitis C: One time screening is recommended by Center for Disease Control  (CDC) for  adults born from [birth year] through 1964.   Completed  . HIV Screening  Completed  *Topic was postponed. The date shown is not the original due date.   Keep follow up appointments

## 2017-02-06 ENCOUNTER — Ambulatory Visit (INDEPENDENT_AMBULATORY_CARE_PROVIDER_SITE_OTHER): Payer: Medicare Other | Admitting: Family

## 2017-02-06 ENCOUNTER — Ambulatory Visit (INDEPENDENT_AMBULATORY_CARE_PROVIDER_SITE_OTHER): Payer: Medicare Other

## 2017-02-06 ENCOUNTER — Encounter: Payer: Self-pay | Admitting: Family

## 2017-02-06 VITALS — BP 122/79 | HR 66 | Temp 97.5°F | Ht 58.75 in | Wt 97.2 lb

## 2017-02-06 DIAGNOSIS — J410 Simple chronic bronchitis: Secondary | ICD-10-CM | POA: Diagnosis not present

## 2017-02-06 DIAGNOSIS — Z0000 Encounter for general adult medical examination without abnormal findings: Secondary | ICD-10-CM | POA: Diagnosis not present

## 2017-02-06 NOTE — Progress Notes (Signed)
Subjective:    Patient ID: Catherine Jensen, female    DOB: 01/17/1951, 66 y.o.   MRN: [id number]  HPI Pt presents to the office for CPE without pap. PT has pap scheduled in May and gets these every year. Pt states she has a history of smoking for 20 years and was diagnosed with COPD. PT states she does not take any medication for COPD. She reports SOB only when she is mowing the yard. Pt denies any headache, palpitations, SOB, or edema at this time.     Review of Systems  All other systems reviewed and are negative.      Objective:   Physical Exam  Constitutional: She is oriented to person, place, and time. She appears well-developed and well-nourished. No distress.  HENT:  Head: Normocephalic and atraumatic.  Right Ear: External ear normal.  Left Ear: External ear normal.  Nose: Nose normal.  Mouth/Throat: Oropharynx is clear and moist.  Eyes: Pupils are equal, round, and reactive to light.  Neck: Normal range of motion. Neck supple. No thyromegaly present.  Cardiovascular: Normal rate, regular rhythm, normal heart sounds and intact distal pulses.   No murmur heard. Pulmonary/Chest: Effort normal. No respiratory distress. She has decreased breath sounds. She has no wheezes.  Abdominal: Soft. Bowel sounds are normal. She exhibits no distension. There is no tenderness.  Musculoskeletal: Normal range of motion. She exhibits no edema or tenderness.  Neurological: She is alert and oriented to person, place, and time.  Skin: Skin is warm and dry.  Psychiatric: She has a normal mood and affect. Her behavior is normal. Judgment and thought content normal.  Vitals reviewed.    BP 122/79   Pulse 66   Temp 97.5 F (36.4 C) (Oral)   Ht 4' 10.75" (1.492 m)   Wt 97 lb 3.2 oz (44.1 kg)   LMP 02/24/1990   BMI 19.80 kg/m      Assessment & Plan:  1. Annual physical exam - CMP14+EGFR - Lipid panel - CBC with Differential/Platelet - Thyroid Panel With TSH - VITAMIN D 25 Hydroxy  (Vit-D Deficiency, Fractures) - Fecal occult blood, imunochemical; Future - EKG 12-Lead - DG Chest 2 View; Future  2. Simple chronic bronchitis (HCC) - CMP14+EGFR - DG Chest 2 View; Future   Continue all meds Labs pending Health Maintenance reviewed Diet and exercise encouraged RTO 1 year   Christy Hawks, FNP  

## 2017-02-06 NOTE — Patient Instructions (Signed)
Health Maintenance, Female Adopting a healthy lifestyle and getting preventive care can go a long way to promote health and wellness. Talk with your health care provider about what schedule of regular examinations is right for you. This is a good chance for you to check in with your provider about disease prevention and staying healthy. In between checkups, there are plenty of things you can do on your own. Experts have done a lot of research about which lifestyle changes and preventive measures are most likely to keep you healthy. Ask your health care provider for more information. Weight and diet Eat a healthy diet  Be sure to include plenty of vegetables, fruits, low-fat dairy products, and lean protein.  Do not eat a lot of foods high in solid fats, added sugars, or salt.  Get regular exercise. This is one of the most important things you can do for your health.  Most adults should exercise for at least 150 minutes each week. The exercise should increase your heart rate and make you sweat (moderate-intensity exercise).  Most adults should also do strengthening exercises at least twice a week. This is in addition to the moderate-intensity exercise. Maintain a healthy weight  Body mass index (BMI) is a measurement that can be used to identify possible weight problems. It estimates body fat based on height and weight. Your health care provider can help determine your BMI and help you achieve or maintain a healthy weight.  For females 76 years of age and older:  A BMI below 18.5 is considered underweight.  A BMI of 18.5 to 24.9 is normal.  A BMI of 25 to 29.9 is considered overweight.  A BMI of 30 and above is considered obese. Watch levels of cholesterol and blood lipids  You should start having your blood tested for lipids and cholesterol at 65 years of age, then have this test every 5 years.  You may need to have your cholesterol levels checked more often if:  Your lipid or  cholesterol levels are high.  You are older than 65 years of age.  You are at high risk for heart disease. Cancer screening Lung Cancer  Lung cancer screening is recommended for adults 64-42 years old who are at high risk for lung cancer because of a history of smoking.  A yearly low-dose CT scan of the lungs is recommended for people who:  Currently smoke.  Have quit within the past 15 years.  Have at least a 30-pack-year history of smoking. A pack year is smoking an average of one pack of cigarettes a day for 1 year.  Yearly screening should continue until it has been 15 years since you quit.  Yearly screening should stop if you develop a health problem that would prevent you from having lung cancer treatment. Breast Cancer  Practice breast self-awareness. This means understanding how your breasts normally appear and feel.  It also means doing regular breast self-exams. Let your health care provider know about any changes, no matter how small.  If you are in your 20s or 30s, you should have a clinical breast exam (CBE) by a health care provider every 1-3 years as part of a regular health exam.  If you are 34 or older, have a CBE every year. Also consider having a breast X-ray (mammogram) every year.  If you have a family history of breast cancer, talk to your health care provider about genetic screening.  If you are at high risk for breast cancer, talk  to your health care provider about having an MRI and a mammogram every year.  Breast cancer gene (BRCA) assessment is recommended for women who have family members with BRCA-related cancers. BRCA-related cancers include:  Breast.  Ovarian.  Tubal.  Peritoneal cancers.  Results of the assessment will determine the need for genetic counseling and BRCA1 and BRCA2 testing. Cervical Cancer  Your health care provider may recommend that you be screened regularly for cancer of the pelvic organs (ovaries, uterus, and vagina).  This screening involves a pelvic examination, including checking for microscopic changes to the surface of your cervix (Pap test). You may be encouraged to have this screening done every 3 years, beginning at age 24.  For women ages 66-65, health care providers may recommend pelvic exams and Pap testing every 3 years, or they may recommend the Pap and pelvic exam, combined with testing for human papilloma virus (HPV), every 5 years. Some types of HPV increase your risk of cervical cancer. Testing for HPV may also be done on women of any age with unclear Pap test results.  Other health care providers may not recommend any screening for nonpregnant women who are considered low risk for pelvic cancer and who do not have symptoms. Ask your health care provider if a screening pelvic exam is right for you.  If you have had past treatment for cervical cancer or a condition that could lead to cancer, you need Pap tests and screening for cancer for at least 20 years after your treatment. If Pap tests have been discontinued, your risk factors (such as having a new sexual partner) need to be reassessed to determine if screening should resume. Some women have medical problems that increase the chance of getting cervical cancer. In these cases, your health care provider may recommend more frequent screening and Pap tests. Colorectal Cancer  This type of cancer can be detected and often prevented.  Routine colorectal cancer screening usually begins at 65 years of age and continues through 65 years of age.  Your health care provider may recommend screening at an earlier age if you have risk factors for colon cancer.  Your health care provider may also recommend using home test kits to check for hidden blood in the stool.  A small camera at the end of a tube can be used to examine your colon directly (sigmoidoscopy or colonoscopy). This is done to check for the earliest forms of colorectal cancer.  Routine  screening usually begins at age 41.  Direct examination of the colon should be repeated every 5-10 years through 65 years of age. However, you may need to be screened more often if early forms of precancerous polyps or small growths are found. Skin Cancer  Check your skin from head to toe regularly.  Tell your health care provider about any new moles or changes in moles, especially if there is a change in a mole's shape or color.  Also tell your health care provider if you have a mole that is larger than the size of a pencil eraser.  Always use sunscreen. Apply sunscreen liberally and repeatedly throughout the day.  Protect yourself by wearing long sleeves, pants, a wide-brimmed hat, and sunglasses whenever you are outside. Heart disease, diabetes, and high blood pressure  High blood pressure causes heart disease and increases the risk of stroke. High blood pressure is more likely to develop in:  People who have blood pressure in the high end of the normal range (130-139/85-89 mm Hg).  People who are overweight or obese.  People who are African American.  If you are 59-24 years of age, have your blood pressure checked every 3-5 years. If you are 34 years of age or older, have your blood pressure checked every year. You should have your blood pressure measured twice-once when you are at a hospital or clinic, and once when you are not at a hospital or clinic. Record the average of the two measurements. To check your blood pressure when you are not at a hospital or clinic, you can use:  An automated blood pressure machine at a pharmacy.  A home blood pressure monitor.  If you are between 29 years and 60 years old, ask your health care provider if you should take aspirin to prevent strokes.  Have regular diabetes screenings. This involves taking a blood sample to check your fasting blood sugar level.  If you are at a normal weight and have a low risk for diabetes, have this test once  every three years after 66 years of age.  If you are overweight and have a high risk for diabetes, consider being tested at a younger age or more often. Preventing infection Hepatitis B  If you have a higher risk for hepatitis B, you should be screened for this virus. You are considered at high risk for hepatitis B if:  You were born in a country where hepatitis B is common. Ask your health care provider which countries are considered high risk.  Your parents were born in a high-risk country, and you have not been immunized against hepatitis B (hepatitis B vaccine).  You have HIV or AIDS.  You use needles to inject street drugs.  You live with someone who has hepatitis B.  You have had sex with someone who has hepatitis B.  You get hemodialysis treatment.  You take certain medicines for conditions, including cancer, organ transplantation, and autoimmune conditions. Hepatitis C  Blood testing is recommended for:  Everyone born from [birth year] through 1964.  Anyone with known risk factors for hepatitis C. Sexually transmitted infections (STIs)  You should be screened for sexually transmitted infections (STIs) including gonorrhea and chlamydia if:  You are sexually active and are younger than 65 years of age.  You are older than 65 years of age and your health care provider tells you that you are at risk for this type of infection.  Your sexual activity has changed since you were last screened and you are at an increased risk for chlamydia or gonorrhea. Ask your health care provider if you are at risk.  If you do not have HIV, but are at risk, it may be recommended that you take a prescription medicine daily to prevent HIV infection. This is called pre-exposure prophylaxis (PrEP). You are considered at risk if:  You are sexually active and do not regularly use condoms or know the HIV status of your partner(s).  You take drugs by injection.  You are sexually active with a partner  who has HIV. Talk with your health care provider about whether you are at high risk of being infected with HIV. If you choose to begin PrEP, you should first be tested for HIV. You should then be tested every 3 months for as long as you are taking PrEP. Pregnancy  If you are premenopausal and you may become pregnant, ask your health care provider about preconception counseling.  If you may become pregnant, take 400 to 800 micrograms (mcg) of folic acid  every day.  If you want to prevent pregnancy, talk to your health care provider about birth control (contraception). Osteoporosis and menopause  Osteoporosis is a disease in which the bones lose minerals and strength with aging. This can result in serious bone fractures. Your risk for osteoporosis can be identified using a bone density scan.  If you are 4 years of age or older, or if you are at risk for osteoporosis and fractures, ask your health care provider if you should be screened.  Ask your health care provider whether you should take a calcium or vitamin D supplement to lower your risk for osteoporosis.  Menopause may have certain physical symptoms and risks.  Hormone replacement therapy may reduce some of these symptoms and risks. Talk to your health care provider about whether hormone replacement therapy is right for you. Follow these instructions at home:  Schedule regular health, dental, and eye exams.  Stay current with your immunizations.  Do not use any tobacco products including cigarettes, chewing tobacco, or electronic cigarettes.  If you are pregnant, do not drink alcohol.  If you are breastfeeding, limit how much and how often you drink alcohol.  Limit alcohol intake to no more than 1 drink per day for nonpregnant women. One drink equals 12 ounces of beer, 5 ounces of wine, or 1 ounces of hard liquor.  Do not use street drugs.  Do not share needles.  Ask your health care provider for help if you need support  or information about quitting drugs.  Tell your health care provider if you often feel depressed.  Tell your health care provider if you have ever been abused or do not feel safe at home. This information is not intended to replace advice given to you by your health care provider. Make sure you discuss any questions you have with your health care provider. Document Released: 05/28/2011 Document Revised: 04/19/2016 Document Reviewed: 08/16/2015 Elsevier Interactive Patient Education  2017 [employer].

## 2017-02-07 ENCOUNTER — Other Ambulatory Visit (INDEPENDENT_AMBULATORY_CARE_PROVIDER_SITE_OTHER): Payer: Medicare Other

## 2017-02-07 DIAGNOSIS — Z0000 Encounter for general adult medical examination without abnormal findings: Secondary | ICD-10-CM | POA: Diagnosis not present

## 2017-02-07 LAB — LIPID PANEL
Chol/HDL Ratio: 2.3 ratio units (ref 0.0–4.4)
Cholesterol, Total: 198 mg/dL (ref 100–199)
HDL: 88 mg/dL (ref >39)
LDL Calculated: 95 mg/dL (ref 0–99)
Triglycerides: 73 mg/dL (ref 0–149)
VLDL Cholesterol Cal: 15 mg/dL (ref 5–40)

## 2017-02-07 LAB — CBC WITH DIFFERENTIAL/PLATELET
Basophils Absolute: 0.1 10*3/uL (ref 0.0–0.2)
Basos: 1 %
EOS (ABSOLUTE): 0.3 10*3/uL (ref 0.0–0.4)
Eos: 7 %
Hematocrit: 42.3 % (ref 34.0–46.6)
Hemoglobin: 14 g/dL (ref 11.1–15.9)
Immature Grans (Abs): 0 10*3/uL (ref 0.0–0.1)
Immature Granulocytes: 0 %
Lymphocytes Absolute: 1.4 10*3/uL (ref 0.7–3.1)
Lymphs: 34 %
MCH: 31 pg (ref 26.6–33.0)
MCHC: 33.1 g/dL (ref 31.5–35.7)
MCV: 94 fL (ref 79–97)
Monocytes Absolute: 0.3 10*3/uL (ref 0.1–0.9)
Monocytes: 7 %
Neutrophils Absolute: 2.1 10*3/uL (ref 1.4–7.0)
Neutrophils: 51 %
Platelets: 222 10*3/uL (ref 150–379)
RBC: 4.51 x10E6/uL (ref 3.77–5.28)
RDW: 13 % (ref 12.3–15.4)
WBC: 4.1 10*3/uL (ref 3.4–10.8)

## 2017-02-07 LAB — CMP14+EGFR
ALT: 15 IU/L (ref 0–32)
AST: 23 IU/L (ref 0–40)
Albumin/Globulin Ratio: 2.2 (ref 1.2–2.2)
Albumin: 4.4 g/dL (ref 3.6–4.8)
Alkaline Phosphatase: 71 IU/L (ref 39–117)
BUN/Creatinine Ratio: 11 — ABNORMAL LOW (ref 12–28)
BUN: 8 mg/dL (ref 8–27)
Bilirubin Total: 0.4 mg/dL (ref 0.0–1.2)
CO2: 25 mmol/L (ref 18–29)
Calcium: 9.9 mg/dL (ref 8.7–10.3)
Chloride: 99 mmol/L (ref 96–106)
Creatinine, Ser: 0.71 mg/dL (ref 0.57–1.00)
GFR calc Af Amer: 103 mL/min/{1.73_m2} (ref >59)
GFR calc non Af Amer: 90 mL/min/{1.73_m2} (ref >59)
Globulin, Total: 2 g/dL (ref 1.5–4.5)
Glucose: 92 mg/dL (ref 65–99)
Potassium: 4.2 mmol/L (ref 3.5–5.2)
Sodium: 141 mmol/L (ref 134–144)
Total Protein: 6.4 g/dL (ref 6.0–8.5)

## 2017-02-07 LAB — THYROID PANEL WITH TSH
Free Thyroxine Index: 2.2 (ref 1.2–4.9)
T3 Uptake Ratio: 27 % (ref 24–39)
T4, Total: 8.1 ug/dL (ref 4.5–12.0)
TSH: 1.26 u[IU]/mL (ref 0.450–4.500)

## 2017-02-07 LAB — VITAMIN D 25 HYDROXY (VIT D DEFICIENCY, FRACTURES): Vit D, 25-Hydroxy: 95.5 ng/mL (ref 30.0–100.0)

## 2017-02-09 LAB — FECAL OCCULT BLOOD, IMMUNOCHEMICAL: Fecal Occult Bld: NEGATIVE

## 2017-02-21 ENCOUNTER — Encounter: Payer: Self-pay | Admitting: Family

## 2017-02-21 DIAGNOSIS — Z1231 Encounter for screening mammogram for malignant neoplasm of breast: Secondary | ICD-10-CM | POA: Diagnosis not present

## 2017-04-23 ENCOUNTER — Ambulatory Visit (INDEPENDENT_AMBULATORY_CARE_PROVIDER_SITE_OTHER): Payer: Medicare Other | Admitting: Certified Nurse Midwife

## 2017-04-23 ENCOUNTER — Encounter: Payer: Self-pay | Admitting: Certified Nurse Midwife

## 2017-04-23 VITALS — BP 120/80 | HR 72 | Resp 12 | Ht 59.0 in | Wt 93.0 lb

## 2017-04-23 DIAGNOSIS — Z01419 Encounter for gynecological examination (general) (routine) without abnormal findings: Secondary | ICD-10-CM | POA: Diagnosis not present

## 2017-04-23 DIAGNOSIS — N951 Menopausal and female climacteric states: Secondary | ICD-10-CM

## 2017-04-23 DIAGNOSIS — Z1231 Encounter for screening mammogram for malignant neoplasm of breast: Secondary | ICD-10-CM

## 2017-04-23 DIAGNOSIS — Z1239 Encounter for other screening for malignant neoplasm of breast: Secondary | ICD-10-CM

## 2017-04-23 NOTE — Patient Instructions (Signed)

## 2017-04-23 NOTE — Progress Notes (Signed)
66 y.o. G51P1001 Married  Caucasian Fe here for annual exam. Menopausal no HRT.Denies vaginal bleeding. Using coconut oil for vaginal dryness with good results. Sees PCP yearly. Brought labs with her from PCP all normal.  Has now retired and playing golf 4-5 times a week. Really enjoying her time off now. Not using sunscreen, but aware of risk with continued exposure. Sees Dermatology yearly with some skin removals, no melanoma. Recent mammogram showed c density and no 3D available in [place]. Patient would like to do next year in [place]. No other health issues today. Planning another vacation.  Patient's last menstrual period was 02/24/1990.          Sexually active: No.  The current method of family planning is status post hysterectomy.    Exercising: No.  The patient does not participate in regular exercise at present. Smoker:  no  Health Maintenance: Pap:  2013 neg History of Abnormal Pap: no MMG:  02-21-17 category c density birads 1:neg Self Breast exams: yes Colonoscopy:  2011 neg f/u 40yrs BMD:   2017 TDaP:  2017 Shingles: no, patient doesn't want  Pneumonia: 2018 Hep C and HIV: both neg 2017 Labs: PCP    reports that she quit smoking about 8 years ago. Her smoking use included Cigarettes. She has a 10.50 pack-year smoking history. She has never used smokeless tobacco. She reports that she does not drink alcohol or use drugs.  Past Medical History:  Diagnosis Date  . COPD (chronic obstructive pulmonary disease) ([place])   . Endometriosis   . FH: lung cancer   . H/O blood clots    in legs  . Multiple allergies    pt states she took allergy shots x 20 yrs  . Tobacco abuse     Past Surgical History:  Procedure Laterality Date  . SKIN LESION EXCISION    . TOTAL ABDOMINAL HYSTERECTOMY  4/91  . TUBAL LIGATION    . TUMOR REMOVAL      Current Outpatient Prescriptions  Medication Sig Dispense Refill  . ASPIRIN PO Take by mouth. 2 daily    . BIOTIN PO Take by mouth daily.     . calcium citrate (CALCITRATE - DOSED IN MG ELEMENTAL CALCIUM) 950 MG tablet Take 1 tablet by mouth daily.      . Cholecalciferol (VITAMIN D) 1000 UNITS capsule Take 1,000 Units by mouth daily.      . Multiple Vitamins-Minerals (MULTIVITAMIN WITH MINERALS) tablet Take 1 tablet by mouth daily.       No current facility-administered medications for this visit.     Family History  Problem Relation Age of Onset  . Heart attack Brother   . Diabetes Brother   . Kidney failure Brother        7  . Lung cancer Sister        w/ mets to brain  . Asthma Sister   . Crohn's disease Sister   . Cancer Sister        lung cancer  . Heart disease Mother   . Diabetes Mother     ROS:  Pertinent items are noted in HPI.  Otherwise, a comprehensive ROS was negative.  Exam:   BP (!) 168/80 (BP Location: Right Arm, Patient Position: Sitting, Cuff Size: Normal)   Pulse (!) 58   Resp 12   Ht 4\' 11"  (1.499 m)   Wt 93 lb (42.2 kg)   LMP 02/24/1990   BMI 18.78 kg/m  Height: 4\' 11"  (149.9 cm) Ht  Readings from Last 3 Encounters:  04/23/17 4\' 11"  (1.499 m)  02/06/17 4' 10.75" (1.492 m)  01/09/17 4' 10.75" (1.492 m)    General appearance: alert, cooperative and appears stated age Head: Normocephalic, without obvious abnormality, atraumatic Neck: no adenopathy, supple, symmetrical, trachea midline and thyroid normal to inspection and palpation Lungs: clear to auscultation bilaterally Breasts: normal appearance, no masses or tenderness, No nipple retraction or dimpling, No nipple discharge or bleeding, No axillary or supraclavicular adenopathy Heart: regular rate and rhythm Abdomen: soft, non-tender; no masses,  no organomegaly Extremities: extremities normal, atraumatic, no cyanosis or edema Skin: Skin color, texture, turgor normal. No rashes or lesions Lymph nodes: Cervical, supraclavicular, and axillary nodes normal. No abnormal inguinal nodes palpated Neurologic: Grossly normal   Pelvic:  External genitalia:  no lesions              Urethra:  normal appearing urethra with no masses, tenderness or lesions              Bartholin's and Skene's: normal                 Vagina: normal appearing vagina with normal color and discharge, no lesions              Cervix: absent              Pap taken: No. Bimanual Exam:  Uterus:  uterus absent              Adnexa: normal adnexa and no mass, fullness, tenderness               Rectovaginal: Confirms               Anus:  normal sphincter tone, no lesions  Chaperone present: yes  A:  Well Woman with normal exam  Menopausal no HRT, S/P TAH for bleeding  Atrophic vaginitis using coconut oil , not sexually active  Sees PCP for aex/labs  P:   Reviewed health and wellness pertinent to exam  Discussed more frequent use of coconut oil for better results, suggested 3-4 times weekly  Pap smear: no  counseled on breast self exam, mammography screening, feminine hygiene, adequate intake of calcium and vitamin D, diet and exercise [facility].  return annually or prn  An After Visit Summary was printed and given to the patient.

## 2017-04-23 NOTE — Progress Notes (Signed)
Patient scheduled while in office for AEX and screening MMG. AEX scheduled for 04/25/18 at 10:30am with Melvia Heaps, CNM. Spoke with Cherish at [facility], patient scheduled for 3D screening MMG on 04/25/18 arriving at 8:40am for 9am appointment. Patient verbalizes understanding and is agreeable to date and time.

## 2017-06-14 DIAGNOSIS — H11003 Unspecified pterygium of eye, bilateral: Secondary | ICD-10-CM | POA: Diagnosis not present

## 2017-06-14 DIAGNOSIS — H04123 Dry eye syndrome of bilateral lacrimal glands: Secondary | ICD-10-CM | POA: Diagnosis not present

## 2017-06-14 DIAGNOSIS — H2513 Age-related nuclear cataract, bilateral: Secondary | ICD-10-CM | POA: Diagnosis not present

## 2017-09-04 ENCOUNTER — Encounter: Payer: Self-pay | Admitting: Nurse Practitioner

## 2017-09-04 ENCOUNTER — Ambulatory Visit (INDEPENDENT_AMBULATORY_CARE_PROVIDER_SITE_OTHER): Payer: Medicare Other | Admitting: Nurse Practitioner

## 2017-09-04 VITALS — BP 128/82 | HR 56 | Temp 97.2°F | Ht 59.0 in | Wt 95.0 lb

## 2017-09-04 DIAGNOSIS — L0233 Carbuncle of buttock: Secondary | ICD-10-CM

## 2017-09-04 DIAGNOSIS — Z23 Encounter for immunization: Secondary | ICD-10-CM | POA: Diagnosis not present

## 2017-09-04 MED ORDER — SULFAMETHOXAZOLE-TRIMETHOPRIM 800-160 MG PO TABS: 1.0000 | ORAL_TABLET | Freq: Two times a day (BID) | ORAL | 0 refills | Status: DC

## 2017-09-04 NOTE — Progress Notes (Signed)
   Subjective:    Patient ID: Catherine Jensen, female    DOB: Mar 24, 1951, 66 y.o.   MRN: [id number]  HPI Patient comes in today c/o boils popping up on both butt cheeks for about 2 weeks. Most of them have resolved on there own. Has ooneon right side that is sore to touch.    Review of Systems  Constitutional: Negative.   Respiratory: Negative.   Cardiovascular: Negative.   Skin: Positive for wound.  Neurological: Negative.   Psychiatric/Behavioral: Negative.   All other systems reviewed and are negative.      Objective:   Physical Exam  Constitutional: She is oriented to person, place, and time. She appears well-developed and well-nourished. No distress.  Cardiovascular: Normal rate and regular rhythm.   Pulmonary/Chest: Effort normal and breath sounds normal.  Neurological: She is alert and oriented to person, place, and time.  Skin: Skin is warm.  Multiple erythematous scabbed over lesion on left buttocks. 3cm erythematous raised tender lesion on right butttocks- no drainage  Psychiatric: She has a normal mood and affect. Her behavior is normal. Judgment normal.   BP 128/82   Pulse (!) 56   Temp (!) 97.2 F (36.2 C) (Oral)   Ht 4\' 11"  (1.499 m)   Wt 95 lb (43.1 kg)   LMP 02/24/1990   BMI 19.19 kg/m       Assessment & Plan:   1. Carbuncle of buttock    Clean entire body with betadine- exclude face Do not pick or scratch at lesions Warm compresses to infected lesion Meds ordered this encounter  Medications  . sulfamethoxazole-trimethoprim (BACTRIM DS) 800-160 MG tablet    Sig: Take 1 tablet by mouth 2 (two) times daily.    Dispense:  20 tablet    Refill:  0    Order Specific Question:   Supervising Provider    Answer:   Eustaquio Maize [[id number]]   Lyndzie-Margaret Hassell Done, FNP

## 2017-09-04 NOTE — Patient Instructions (Signed)
Skin Abscess A skin abscess is an infected area on or under your skin that contains pus and other material. An abscess can happen almost anywhere on your body. Some abscesses break open (rupture) on their own. Most continue to get worse unless they are treated. The infection can spread deeper into the body and into your blood, which can make you feel sick. Treatment usually involves draining the abscess. Follow these instructions at home: Abscess Care  If you have an abscess that has not drained, place a warm, clean, wet washcloth over the abscess several times a day. Do this as told by your doctor.  Follow instructions from your doctor about how to take care of your abscess. Make sure you: ? Cover the abscess with a bandage (dressing). ? Change your bandage or gauze as told by your doctor. ? Wash your hands with soap and water before you change the bandage or gauze. If you cannot use soap and water, use hand sanitizer.  Check your abscess every day for signs that the infection is getting worse. Check for: ? More redness, swelling, or pain. ? More fluid or blood. ? Warmth. ? More pus or a bad smell. Medicines   Take over-the-counter and prescription medicines only as told by your doctor.  If you were prescribed an antibiotic medicine, take it as told by your doctor. Do not stop taking the antibiotic even if you start to feel better. General instructions  To avoid spreading the infection: ? Do not share personal care items, towels, or hot tubs with others. ? Avoid making skin-to-skin contact with other people.  Keep all follow-up visits as told by your doctor. This is important. Contact a doctor if:  You have more redness, swelling, or pain around your abscess.  You have more fluid or blood coming from your abscess.  Your abscess feels warm when you touch it.  You have more pus or a bad smell coming from your abscess.  You have a fever.  Your muscles ache.  You have  chills.  You feel sick. Get help right away if:  You have very bad (severe) pain.  You see red streaks on your skin spreading away from the abscess. This information is not intended to replace advice given to you by your health care provider. Make sure you discuss any questions you have with your health care provider. Document Released: 04/30/2008 Document Revised: 07/08/2016 Document Reviewed: 09/21/2015 Elsevier Interactive Patient Education  2018 Elsevier Inc.  

## 2018-03-24 ENCOUNTER — Telehealth: Payer: Self-pay | Admitting: Family

## 2018-03-24 NOTE — Telephone Encounter (Signed)
Patient has a follow up appointment scheduled. 

## 2018-03-25 ENCOUNTER — Ambulatory Visit (INDEPENDENT_AMBULATORY_CARE_PROVIDER_SITE_OTHER): Payer: Medicare Other

## 2018-03-25 ENCOUNTER — Ambulatory Visit (INDEPENDENT_AMBULATORY_CARE_PROVIDER_SITE_OTHER): Payer: Medicare Other | Admitting: Family

## 2018-03-25 ENCOUNTER — Encounter: Payer: Self-pay | Admitting: Family

## 2018-03-25 VITALS — BP 127/79 | HR 82 | Temp 97.5°F | Ht 59.0 in | Wt 91.2 lb

## 2018-03-25 DIAGNOSIS — R0602 Shortness of breath: Secondary | ICD-10-CM

## 2018-03-25 DIAGNOSIS — J441 Chronic obstructive pulmonary disease with (acute) exacerbation: Secondary | ICD-10-CM

## 2018-03-25 DIAGNOSIS — R0600 Dyspnea, unspecified: Secondary | ICD-10-CM | POA: Diagnosis not present

## 2018-03-25 MED ORDER — AZITHROMYCIN 250 MG PO TABS: ORAL_TABLET | ORAL | 0 refills | Status: DC

## 2018-03-25 MED ORDER — PREDNISONE 10 MG (21) PO TBPK: ORAL_TABLET | ORAL | 0 refills | Status: DC

## 2018-03-25 NOTE — Progress Notes (Signed)
   Subjective:    Patient ID: Catherine Jensen, female    DOB: May 26, 1951, 67 y.o.   MRN: [id number]  Cough  This is a new problem. The current episode started in the past 7 days. The problem has been gradually worsening. The problem occurs every few minutes. The cough is productive of sputum and productive of purulent sputum. Associated symptoms include chills, a fever, myalgias, nasal congestion, postnasal drip, rhinorrhea, a sore throat, shortness of breath and wheezing. Pertinent negatives include no ear congestion or ear pain. The symptoms are aggravated by lying down and pollens. She has tried rest and OTC cough suppressant for the symptoms. The treatment provided mild relief. There is no history of asthma or COPD.      Review of Systems  Constitutional: Positive for chills and fever.  HENT: Positive for postnasal drip, rhinorrhea and sore throat. Negative for ear pain.   Respiratory: Positive for cough, shortness of breath and wheezing.   Musculoskeletal: Positive for myalgias.  All other systems reviewed and are negative.      Objective:   Physical Exam  Constitutional: She is oriented to person, place, and time. She appears well-developed and well-nourished. No distress.  HENT:  Head: Normocephalic and atraumatic.  Right Ear: External ear normal.  Left Ear: External ear normal.  Nose: Mucosal edema and rhinorrhea present.  Mouth/Throat: Oropharynx is clear and moist.  Eyes: Pupils are equal, round, and reactive to light.  Neck: Normal range of motion. Neck supple. No thyromegaly present.  Cardiovascular: Normal rate, regular rhythm, normal heart sounds and intact distal pulses.  No murmur heard. Pulmonary/Chest: Effort normal. No respiratory distress. She has decreased breath sounds. She has no wheezes.  Abdominal: Soft. Bowel sounds are normal. She exhibits no distension. There is no tenderness.  Musculoskeletal: Normal range of motion. She exhibits no edema or tenderness.    Neurological: She is alert and oriented to person, place, and time. She has normal reflexes. No cranial nerve deficit.  Skin: Skin is warm and dry.  Psychiatric: She has a normal mood and affect. Her behavior is normal. Judgment and thought content normal.  Vitals reviewed.     BP 127/79   Pulse 82   Temp (!) 97.5 F (36.4 C) (Oral)   Ht 4\' 11"  (1.499 m)   Wt 91 lb 3.2 oz (41.4 kg)   LMP 02/24/1990   BMI 18.42 kg/m      Assessment & Plan:  1. Chronic obstructive pulmonary disease with acute exacerbation (HCC) Rest Force fluids Pt does not wish to be on any inhalers at this time - azithromycin (ZITHROMAX) 250 MG tablet; Take 500 mg once, then 250 mg for four days  Dispense: 6 tablet; Refill: 0 - predniSONE (STERAPRED UNI-PAK 21 TAB) 10 MG (21) TBPK tablet; Use as directed  Dispense: 21 tablet; Refill: 0 - DG Chest 2 View; Future  2. SOB (shortness of breath) - DG Chest 2 View; Future   [name], FNP

## 2018-03-25 NOTE — Patient Instructions (Signed)

## 2018-03-27 ENCOUNTER — Telehealth: Payer: Self-pay | Admitting: Family

## 2018-03-28 NOTE — Telephone Encounter (Signed)
Make sure she takes the prednisone in the morning.  It is tampering so it should be a lower dose each day that she may be able to tolerate better. Yes, she can take Nightquil

## 2018-03-28 NOTE — Telephone Encounter (Signed)
Aware of provider's advice. 

## 2018-04-25 ENCOUNTER — Other Ambulatory Visit: Payer: Self-pay

## 2018-04-25 ENCOUNTER — Other Ambulatory Visit: Payer: Self-pay | Admitting: Certified Nurse Midwife

## 2018-04-25 ENCOUNTER — Ambulatory Visit
Admission: RE | Admit: 2018-04-25 | Discharge: 2018-04-25 | Disposition: A | Discharge status: Home / Self Care | Payer: Medicare Other | Source: Ambulatory Visit | Attending: Certified Nurse Midwife | Admitting: Certified Nurse Midwife

## 2018-04-25 ENCOUNTER — Encounter: Payer: Self-pay | Admitting: Certified Nurse Midwife

## 2018-04-25 ENCOUNTER — Ambulatory Visit (INDEPENDENT_AMBULATORY_CARE_PROVIDER_SITE_OTHER): Payer: Medicare Other | Admitting: Certified Nurse Midwife

## 2018-04-25 VITALS — BP 140/72 | HR 64 | Resp 16 | Ht 59.0 in | Wt 89.0 lb

## 2018-04-25 DIAGNOSIS — Z01419 Encounter for gynecological examination (general) (routine) without abnormal findings: Secondary | ICD-10-CM | POA: Diagnosis not present

## 2018-04-25 DIAGNOSIS — N952 Postmenopausal atrophic vaginitis: Secondary | ICD-10-CM

## 2018-04-25 DIAGNOSIS — Z8709 Personal history of other diseases of the respiratory system: Secondary | ICD-10-CM | POA: Diagnosis not present

## 2018-04-25 DIAGNOSIS — N951 Menopausal and female climacteric states: Secondary | ICD-10-CM | POA: Diagnosis not present

## 2018-04-25 DIAGNOSIS — Z1231 Encounter for screening mammogram for malignant neoplasm of breast: Secondary | ICD-10-CM

## 2018-04-25 DIAGNOSIS — Z1239 Encounter for other screening for malignant neoplasm of breast: Secondary | ICD-10-CM

## 2018-04-25 NOTE — Progress Notes (Signed)
67 y.o. G83P1001 Married  Caucasian Fe here for annual exam. Recovering from URI with steroid use. Feeling better, but still some congestion. Sees PCP in one week for wellness check and will follow up then Denies vaginal bleeding, still having vaginal dryness, but using coconut oil with good results. Declines in estrogen product use. Golfing 3 or more days a week. Still enjoys! [facility] and [facility] went well. Sees PCP in one week for aex and labs. Keeping up with dental exam and eye exam yearly. Has noted some hair thinning and considering leaving products of color off. Eating well, no change in diet. No other health concerns today.  Patient's last menstrual period was 02/24/1990.          Sexually active: No.  The current method of family planning is status post hysterectomy.    Exercising: Yes.    walking/gofting/yard work  Smoker:  Former smoker  Health Maintenance: Pap:  2013 neg History of Abnormal Pap: no MMG:  today Self Breast exams: yes Colonoscopy:  2011 neg f/u 77yrs BMD:   2017 3 years TDaP:  2017 Shingles: no Pneumonia: 2018 Hep C and HIV: both neg 2017 Labs: Patient would like labs drawn    reports that she quit smoking about 9 years ago. Her smoking use included cigarettes. She has a 10.50 pack-year smoking history. She has never used smokeless tobacco. She reports that she does not drink alcohol or use drugs.  Past Medical History:  Diagnosis Date  . COPD (chronic obstructive pulmonary disease) ([place])   . Endometriosis   . FH: lung cancer   . H/O blood clots    in legs  . Multiple allergies    pt states she took allergy shots x 20 yrs  . Tobacco abuse     Past Surgical History:  Procedure Laterality Date  . SKIN LESION EXCISION    . TOTAL ABDOMINAL HYSTERECTOMY  4/91  . TUBAL LIGATION    . TUMOR REMOVAL      Current Outpatient Medications  Medication Sig Dispense Refill  . ASPIRIN PO Take by mouth. 2 daily    .  azithromycin (ZITHROMAX) 250 MG tablet Take 500 mg once, then 250 mg for four days 6 tablet 0  . BIOTIN PO Take by mouth daily.    . calcium citrate (CALCITRATE - DOSED IN MG ELEMENTAL CALCIUM) 950 MG tablet Take 1 tablet by mouth daily.      . Cholecalciferol (VITAMIN D) 1000 UNITS capsule Take 1,000 Units by mouth daily.      . Multiple Vitamins-Minerals (MULTIVITAMIN WITH MINERALS) tablet Take 1 tablet by mouth daily.      . predniSONE (STERAPRED UNI-PAK 21 TAB) 10 MG (21) TBPK tablet Use as directed 21 tablet 0   No current facility-administered medications for this visit.     Family History  Problem Relation Age of Onset  . Heart attack Brother   . Diabetes Brother   . Kidney failure Brother        48  . Lung cancer Sister        w/ mets to brain  . Asthma Sister   . Crohn's disease Sister   . Cancer Sister        lung cancer  . Heart disease Mother   . Diabetes Mother     ROS:  Pertinent items are noted in HPI.  Otherwise, a comprehensive ROS was negative.  Exam:   LMP 02/24/1990  Ht Readings from Last 3 Encounters:  03/25/18 4\' 11"  (1.499 m)  09/04/17 4\' 11"  (1.499 m)  04/23/17 4\' 11"  (1.499 m)    General appearance: alert, cooperative and appears stated age Head: Normocephalic, without obvious abnormality, atraumatic Neck: no adenopathy, supple, symmetrical, trachea midline and thyroid normal to inspection and palpation Lungs: clear to auscultation bilaterally Breasts: normal appearance, no masses or tenderness, No nipple retraction or dimpling, No nipple discharge or bleeding, No axillary or supraclavicular adenopathy Heart: regular rate and rhythm Abdomen: soft, non-tender; no masses,  no organomegaly Extremities: extremities normal, atraumatic, no cyanosis or edema Skin: Skin color, texture, turgor normal. No rashes or lesions Lymph nodes: Cervical, supraclavicular, and axillary nodes normal. No abnormal inguinal nodes palpated Neurologic: Grossly  normal   Pelvic: External genitalia:  no lesions , atrophic appearance              Urethra:  normal appearing urethra with no masses, tenderness or lesions              Bartholin's and Skene's: normal                 Vagina:atrophic appearing vagina with normal color and scant discharge, no lesions              Cervix: absent              Pap taken: No. Bimanual Exam:  Uterus:  uterus absent              Adnexa: no mass, fullness, tenderness, normal adnexa               Rectovaginal: Confirms               Anus:  normal sphincter tone, no lesions  Chaperone present: yes  A:  Well Woman with normal exam  Menopausal S/P TAH for bleeding  Atrophic vaginitis with coconut oil use with good results.  COPD with PCP management, recent Bronchitis with steroid use, improving  P:   Reviewed health and wellness pertinent to exam  Continue coconut oil use as discussed  Stressed importance of wellness check up with PCP and labs  Pap smear: no  counseled on breast self exam, mammography screening, feminine hygiene, adequate intake of calcium and vitamin D, diet and exercise  return annually or prn  An After Visit Summary was printed and given to the patient.

## 2018-04-25 NOTE — Patient Instructions (Signed)

## 2018-07-09 ENCOUNTER — Encounter: Payer: Self-pay | Admitting: *Deleted

## 2018-07-09 ENCOUNTER — Ambulatory Visit (INDEPENDENT_AMBULATORY_CARE_PROVIDER_SITE_OTHER): Payer: Medicare Other | Admitting: *Deleted

## 2018-07-09 VITALS — BP 129/75 | HR 71 | Ht 59.0 in | Wt 89.0 lb

## 2018-07-09 DIAGNOSIS — Z136 Encounter for screening for cardiovascular disorders: Secondary | ICD-10-CM | POA: Diagnosis not present

## 2018-07-09 DIAGNOSIS — Z1211 Encounter for screening for malignant neoplasm of colon: Secondary | ICD-10-CM

## 2018-07-09 DIAGNOSIS — Z0000 Encounter for general adult medical examination without abnormal findings: Secondary | ICD-10-CM | POA: Diagnosis not present

## 2018-07-09 NOTE — Patient Instructions (Signed)
  Catherine Jensen , Thank you for taking time to come for your Medicare Wellness Visit. I appreciate your ongoing commitment to your health goals. Please review the following plan we discussed and let me know if I can assist you in the future.   These are the goals we discussed: Goals    . Exercise 3x per week (30 min per time)    . Have 3 meals a day       This is a list of the screening recommended for you and due dates:  Health Maintenance  Topic Date Due  . Cologuard (Stool DNA test)  01/07/2002  . Pneumonia vaccines (2 of 2 - PPSV23) 04/08/2018  . Flu Shot  06/26/2018  . Mammogram  04/25/2020  . Tetanus Vaccine  04/18/2026  . DEXA scan (bone density measurement)  Completed  .  Hepatitis C: One time screening is recommended by Center for Disease Control  (CDC) for  adults born from [birth year] through 1965.   Completed

## 2018-07-09 NOTE — Progress Notes (Addendum)
Subjective:   Catherine Jensen is a 67 y.o. female who presents for a Medicare Annual Wellness Visit. Catherine Jensen lives at home with her husband of 22 years. 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 She is a member of several women's golf groups. She also lifts 10lb weights three times a day to help with upper body strength. She has one son and 3 grandchildren.    Review of Systems    Patient reports that her overall health is unchanged compared to last year.  Cardiac Risk Factors include: advanced age (>13mn, >>17women)   All other systems negative       Current Medications (verified) Outpatient Encounter Medications as of 07/09/2018  Medication Sig  . ASPIRIN PO Take by mouth. 2 daily  . BIOTIN PO Take by mouth daily.  . calcium citrate (CALCITRATE - DOSED IN MG ELEMENTAL CALCIUM) 950 MG tablet Take 1 tablet by mouth daily.    . Cholecalciferol (VITAMIN D) 1000 UNITS capsule Take 1,000 Units by mouth daily.    . Multiple Vitamins-Minerals (MULTIVITAMIN WITH MINERALS) tablet Take 1 tablet by mouth daily.     No facility-administered encounter medications on file as of 07/09/2018.     Allergies (verified) Codeine; Musk oil fragrance; and Walnuts [nuts]   History: Past Medical History:  Diagnosis Date  . [place]   . Endometriosis   . FH: lung cancer   . H/O blood clots    in legs  . Multiple allergies    pt states she took allergy shots x 20 yrs  . Tobacco abuse    Past Surgical History:  Procedure Laterality Date  . ABLATION ON ENDOMETRIOSIS    . SKIN LESION EXCISION    . TOTAL ABDOMINAL HYSTERECTOMY  4/91  . TUBAL LIGATION    . TUMOR REMOVAL     Family History  Problem Relation Age of Onset  . Heart attack Brother   . Diabetes Brother   . Kidney failure Brother        421 . Lung cancer Sister        w/ mets to brain  . Asthma Sister   . Crohn's disease Sister   . Cancer Sister        lung cancer   . Heart disease Mother   . Diabetes Mother    Social History   Socioeconomic History  . Marital status: Married    Spouse name: Not on file  . Number of children: 1  . Years of education: 149 . Highest education level: 12th grade  Occupational History  . 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 Financial resource strain: Not hard at all  . Food insecurity:    Worry: Never true    Inability: Never true  . Transportation needs:    Medical: No    Non-medical: No  Tobacco Use  . Smoking status: Former Smoker    Packs/day: 0.50    Years: 21.00    Pack years: 10.50    Types: Cigarettes    Last attempt to quit: 11/26/2008    Years since quitting: 9.6  . Smokeless tobacco: Never Used  Substance and Sexual Activity  . Alcohol use: No  . Drug use: No  . Sexual activity: Not Currently    Partners: Male    Comment: hysterectomy  Lifestyle  .  Physical activity:    Days per week: 5 days    Minutes per session: 60 min  . Stress: Not at all  Relationships  . Social connections:    Talks on phone: More than three times a week    Gets together: More than three times a week    Attends religious service: Never    Active member of club or organization: Yes    Attends meetings of clubs or organizations: More than 4 times per year    Relationship status: Married  Other Topics Concern  . Not on file  Social History Narrative  . Not on file    Tobacco Use No.  Clinical Intake:  Pre-visit preparation completed: No  Pain : No/denies pain     Nutritional Status: BMI <19  Underweight Diabetes: No  How often do you need to have someone help you when you read instructions, pamphlets, or other written materials from your doctor or pharmacy?: 1 - Never What is the last grade level you completed in school?: 12     Information entered by :: Chong Sicilian, RN   Activities of Daily Living In your present state of  health, do you have any difficulty performing the following activities: 07/09/2018  Hearing? N  Vision? N  Difficulty concentrating or making decisions? N  Walking or climbing stairs? N  Dressing or bathing? N  Doing errands, shopping? N  Preparing Food and eating ? N  Using the Toilet? N  In the past six months, have you accidently leaked urine? N  Do you have problems with loss of bowel control? N  Managing your Medications? N  Managing your Finances? N  Housekeeping or managing your Housekeeping? N  Some recent data might be hidden    Diet Cooks most meals. 3 meals a day. Drinks a lot of water  Exercise Current Exercise Habits: Home exercise routine, Type of exercise: walking;strength training/weights, Time (Minutes): 30, Frequency (Times/Week): 5, Weekly Exercise (Minutes/Week): 150, Intensity: Moderate, Exercise limited by: orthopedic condition(s)   Depression Screen PHQ 2/9 Scores 07/24/2018 07/09/2018 03/25/2018 09/04/2017 02/06/2017 01/09/2017 11/23/2016  PHQ - 2 Score 0 0 0 0 0 0 0     Fall Risk Fall Risk  07/24/2018 07/09/2018 03/25/2018 09/04/2017 02/06/2017  Falls in the past year? No No No No No    Safety Is the patient's home free of loose throw rugs in walkways, pet beds, electrical cords, etc?   yes      Handrails on the stairs?   yes      Adequate lighting?   yes  Patient Care Team: Sharion Balloon, FNP as PCP - General (Family Medicine) Ubaldo Glassing, Marny Lowenstein, MD (Inactive) as Referring Physician (Obstetrics and Gynecology)  Hospitalizations, surgeries, and ER visits in previous 12 months No hospitalizations, ER visits, or surgeries this past year.  Objective:    Today's Vitals   07/09/18 0837  BP: 129/75  Pulse: 71  Weight: 89 lb (40.4 kg)  Height: '4\' 11"'$  (1.499 m)   Body mass index is 17.98 kg/m.  Advanced Directives 07/09/2018 01/09/2017  Does Patient Have a Medical Advance Directive? No No  Would patient like information on creating a medical advance  directive? No - Patient declined No - Patient declined    Hearing/Vision  No hearing or vision deficits noted during visit.  Cognitive Function: MMSE - Mini Mental State Exam 07/09/2018 01/09/2017  Orientation to time 5 5  Orientation to Place 5 5  Registration 3 3  Attention/ Calculation 5 5  Recall 1 3  Language- name 2 objects 2 2  Language- repeat 1 1  Language- follow 3 step command 3 3  Language- read & follow direction 1 1  Write a sentence 1 1  Copy design 1 1  Total score 28 30       Normal Cognitive Function Screening: Yes    Immunizations and Health Maintenance Immunization History  Administered Date(s) Administered  . Influenza,inj,Quad PF,6+ Mos 09/14/2013, 01/09/2017, 09/04/2017  . Pneumococcal Conjugate-13 01/09/2017  . Pneumococcal Polysaccharide-23 05/23/2011, 07/24/2018  . Pneumococcal-Unspecified 04/08/2013  . Tdap 04/18/2016   Health Maintenance Due  Topic Date Due  . Fecal DNA (Cologuard)  01/07/2002   Health Maintenance  Topic Date Due  . Fecal DNA (Cologuard)  01/07/2002  . INFLUENZA VACCINE  07/25/2019 (Originally 06/26/2018)  . MAMMOGRAM  04/25/2020  . TETANUS/TDAP  04/18/2026  . DEXA SCAN  Completed  . Hepatitis C Screening  Completed  . PNA vac Low Risk Adult  Completed        Assessment:   This is a routine wellness examination for [facility].    Plan:    Goals    . Exercise 3x per week (30 min per time)    . Have 3 meals a day        Health Maintenance Recommendations: Colorectal cancer screening  Additional Screening Recommendations: Lung: Low Dose CT Chest recommended if Age 34-80 years, 30 pack-year currently smoking OR have quit w/in 15years. Patient does not qualify. Hepatitis C Screening recommended: no  Today's Orders Orders Placed This Encounter  Procedures  . Cologuard  . Lipid panel  . CMP14+EGFR    Keep f/u with Sharion Balloon, FNP and any other specialty appointments you may have Continue current  medications Move carefully to avoid falls. Use assistive devices like a cane or walker if needed. Aim for at least 150 minutes of moderate activity a week. This can be done with chair exercises if necessary. Read or work on puzzles daily Stay connected with friends and family  I have personally reviewed and noted the following in the patient's chart:   . Medical and social history . Use of alcohol, tobacco or illicit drugs  . Current medications and supplements . Functional ability and status . Nutritional status . Physical activity . Advanced directives . List of other physicians . Hospitalizations, surgeries, and ER visits in previous 12 months . Vitals . Screenings to include cognitive, depression, and falls . Referrals and appointments  In addition, I have reviewed and discussed with patient certain preventive protocols, quality metrics, and best practice recommendations. A written personalized care plan for preventive services as well as general preventive health recommendations were provided to patient.     Chong Sicilian, RN   07/09/2018

## 2018-07-10 LAB — CMP14+EGFR
ALT: 17 IU/L (ref 0–32)
AST: 24 IU/L (ref 0–40)
Albumin/Globulin Ratio: 2.3 — ABNORMAL HIGH (ref 1.2–2.2)
Albumin: 4.8 g/dL (ref 3.6–4.8)
Alkaline Phosphatase: 74 IU/L (ref 39–117)
BUN/Creatinine Ratio: 22 (ref 12–28)
BUN: 16 mg/dL (ref 8–27)
Bilirubin Total: 0.5 mg/dL (ref 0.0–1.2)
CO2: 24 mmol/L (ref 20–29)
Calcium: 10.4 mg/dL — ABNORMAL HIGH (ref 8.7–10.3)
Chloride: 102 mmol/L (ref 96–106)
Creatinine, Ser: 0.74 mg/dL (ref 0.57–1.00)
GFR calc Af Amer: 98 mL/min/{1.73_m2} (ref >59)
GFR calc non Af Amer: 85 mL/min/{1.73_m2} (ref >59)
Globulin, Total: 2.1 g/dL (ref 1.5–4.5)
Glucose: 88 mg/dL (ref 65–99)
Potassium: 4.9 mmol/L (ref 3.5–5.2)
Sodium: 141 mmol/L (ref 134–144)
Total Protein: 6.9 g/dL (ref 6.0–8.5)

## 2018-07-10 LAB — LIPID PANEL
Chol/HDL Ratio: 2.3 ratio (ref 0.0–4.4)
Cholesterol, Total: 200 mg/dL — ABNORMAL HIGH (ref 100–199)
HDL: 87 mg/dL (ref >39)
LDL Calculated: 101 mg/dL — ABNORMAL HIGH (ref 0–99)
Triglycerides: 58 mg/dL (ref 0–149)
VLDL Cholesterol Cal: 12 mg/dL (ref 5–40)

## 2018-07-24 ENCOUNTER — Encounter: Payer: Self-pay | Admitting: Family

## 2018-07-24 ENCOUNTER — Ambulatory Visit (INDEPENDENT_AMBULATORY_CARE_PROVIDER_SITE_OTHER): Payer: Medicare Other | Admitting: Family

## 2018-07-24 VITALS — BP 112/68 | HR 70 | Temp 97.5°F | Ht 59.0 in | Wt 89.2 lb

## 2018-07-24 DIAGNOSIS — Z0000 Encounter for general adult medical examination without abnormal findings: Secondary | ICD-10-CM | POA: Diagnosis not present

## 2018-07-24 DIAGNOSIS — Z23 Encounter for immunization: Secondary | ICD-10-CM | POA: Diagnosis not present

## 2018-07-24 DIAGNOSIS — Z1212 Encounter for screening for malignant neoplasm of rectum: Secondary | ICD-10-CM

## 2018-07-24 DIAGNOSIS — Z1211 Encounter for screening for malignant neoplasm of colon: Secondary | ICD-10-CM | POA: Diagnosis not present

## 2018-07-24 DIAGNOSIS — J441 Chronic obstructive pulmonary disease with (acute) exacerbation: Secondary | ICD-10-CM

## 2018-07-24 NOTE — Addendum Note (Signed)
Addended by: Shelbie Ammons on: 07/24/2018 09:33 AM   Modules accepted: Orders

## 2018-07-24 NOTE — Progress Notes (Signed)
   Subjective:    Patient ID: Catherine Jensen, female    DOB: [birth year], 67 y.o.   MRN: [id number]  Chief Complaint  Patient presents with  . Annual Exam    discuss lab results    HPI Pt presents to the office today for CPE without pap. Pt is followed by GYN every year. PT has COPD, but is doing well. Pt states she quit smoking about 4 years ago. Pt denies any headache, palpitations, SOB, or edema at this time.   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   Review of Systems  All other systems reviewed and are negative.      Objective:   Physical Exam  Constitutional: She is oriented to person, place, and time. She appears well-developed and well-nourished. No distress.  HENT:  Head: Normocephalic and atraumatic.  Right Ear: External ear normal.  Left Ear: External ear normal.  Mouth/Throat: Oropharynx is clear and moist.  Eyes: Pupils are equal, round, and reactive to light.  Neck: Normal range of motion. Neck supple. No thyromegaly present.  Cardiovascular: Normal rate, regular rhythm, normal heart sounds and intact distal pulses.  No murmur heard. Pulmonary/Chest: Effort normal. No respiratory distress. She has wheezes.  Abdominal: Soft. Bowel sounds are normal. She exhibits no distension. There is no tenderness.  Musculoskeletal: Normal range of motion. She exhibits no edema or tenderness.  Neurological: She is alert and oriented to person, place, and time. She has normal reflexes. No cranial nerve deficit.  Skin: Skin is warm and dry.  Psychiatric: She has a normal mood and affect. Her behavior is normal. Judgment and thought content normal.  Vitals reviewed.     BP 112/68   Pulse 70   Temp (!) 97.5 F (36.4 C) (Oral)   Ht 4\' 11"  (1.499 m)   Wt 89 lb 3.2 oz (40.5 kg)   LMP 02/24/1990 Comment: [facility] does pap,[name] CNM  BMI 18.02 kg/m      Assessment & Plan:  [name] comes in today with chief complaint  of Annual Exam (discuss lab results)   Diagnosis and orders addressed:  1. Annual physical exam  2. Chronic obstructive pulmonary disease with acute exacerbation ([place])  3. Colon cancer screening - Cologuard  4. Screening for malignant neoplasm of the rectum - Cologuard   Labs reviewed Health Maintenance reviewed Diet and exercise encouraged  Follow up plan: 1 year    [name], FNP

## 2018-07-24 NOTE — Patient Instructions (Signed)

## 2018-07-31 NOTE — Addendum Note (Signed)
Addended by: Ilean China on: 07/31/2018 01:55 PM   Modules accepted: Level of Service

## 2018-08-19 DIAGNOSIS — Z1211 Encounter for screening for malignant neoplasm of colon: Secondary | ICD-10-CM | POA: Diagnosis not present

## 2018-08-19 DIAGNOSIS — Z1212 Encounter for screening for malignant neoplasm of rectum: Secondary | ICD-10-CM | POA: Diagnosis not present

## 2018-08-25 LAB — COLOGUARD: Cologuard: NEGATIVE

## 2018-09-29 ENCOUNTER — Ambulatory Visit (INDEPENDENT_AMBULATORY_CARE_PROVIDER_SITE_OTHER): Payer: Medicare Other

## 2018-09-29 DIAGNOSIS — Z23 Encounter for immunization: Secondary | ICD-10-CM | POA: Diagnosis not present

## 2018-10-07 ENCOUNTER — Ambulatory Visit (INDEPENDENT_AMBULATORY_CARE_PROVIDER_SITE_OTHER): Payer: Medicare Other | Admitting: Family

## 2018-10-07 ENCOUNTER — Encounter: Payer: Self-pay | Admitting: Family

## 2018-10-07 VITALS — BP 117/64 | HR 86 | Temp 98.9°F | Ht 59.0 in | Wt 91.8 lb

## 2018-10-07 DIAGNOSIS — J441 Chronic obstructive pulmonary disease with (acute) exacerbation: Secondary | ICD-10-CM

## 2018-10-07 MED ORDER — BENZONATATE 200 MG PO CAPS: 200.0000 mg | ORAL_CAPSULE | Freq: Three times a day (TID) | ORAL | 1 refills | Status: DC | PRN

## 2018-10-07 MED ORDER — AZITHROMYCIN 250 MG PO TABS: ORAL_TABLET | ORAL | 0 refills | Status: DC

## 2018-10-07 NOTE — Patient Instructions (Signed)

## 2018-10-07 NOTE — Progress Notes (Signed)
   Subjective:    Patient ID: Catherine Jensen, female    DOB: 08-17-1951, 67 y.o.   MRN: [id number]  Chief Complaint  Patient presents with  . Cough    congestion    Cough  This is a new problem. The current episode started yesterday. The problem has been waxing and waning. The problem occurs every few minutes. The cough is productive of purulent sputum and productive of sputum. Associated symptoms include chills, myalgias, nasal congestion, postnasal drip, rhinorrhea and a sore throat. Pertinent negatives include no ear congestion, ear pain, fever or headaches. The symptoms are aggravated by lying down. She has tried rest for the symptoms. The treatment provided mild relief. Her past medical history is significant for COPD.      Review of Systems  Constitutional: Positive for chills. Negative for fever.  HENT: Positive for postnasal drip, rhinorrhea and sore throat. Negative for ear pain.   Respiratory: Positive for cough.   Musculoskeletal: Positive for myalgias.  Neurological: Negative for headaches.  All other systems reviewed and are negative.      Objective:   Physical Exam  Constitutional: She is oriented to person, place, and time. She appears well-developed and well-nourished. No distress.  HENT:  Head: Normocephalic and atraumatic.  Right Ear: External ear normal.  Mouth/Throat: Oropharynx is clear and moist.  Eyes: Pupils are equal, round, and reactive to light.  Neck: Normal range of motion. Neck supple. No thyromegaly present.  Cardiovascular: Normal rate, regular rhythm, normal heart sounds and intact distal pulses.  No murmur heard. Pulmonary/Chest: Effort normal. No respiratory distress. She has decreased breath sounds. She has no wheezes. She has rhonchi.  Abdominal: Soft. Bowel sounds are normal. She exhibits no distension. There is no tenderness.  Musculoskeletal: Normal range of motion. She exhibits no edema or tenderness.  Neurological: She is alert and  oriented to person, place, and time. She has normal reflexes. No cranial nerve deficit.  Skin: Skin is warm and dry.  Psychiatric: She has a normal mood and affect. Her behavior is normal. Judgment and thought content normal.  Vitals reviewed.     BP 117/64   Pulse 86   Temp 98.9 F (37.2 C) (Oral)   Ht 4\' 11"  (1.499 m)   Wt 91 lb 12.8 oz (41.6 kg)   LMP 02/24/1990 Comment: [facility] does pap,[name] CNM  BMI 18.54 kg/m      Assessment & Plan:  Catherine Jensen comes in today with chief complaint of Cough (congestion)   Diagnosis and orders addressed:  1. COPD exacerbation ([place]) - Take meds as prescribed - Use a cool mist humidifier  -Use saline nose sprays frequently -Force fluids -For any cough or congestion  Use plain Mucinex- regular strength or max strength is fine -For fever or aces or pains- take tylenol or ibuprofen. -Throat lozenges if help -RTO if symptoms worsen or do not improve - azithromycin (ZITHROMAX) 250 MG tablet; Take 500 mg once, then 250 mg for four days  Dispense: 6 tablet; Refill: 0 - benzonatate (TESSALON) 200 MG capsule; Take 1 capsule (200 mg total) by mouth 3 (three) times daily as needed.  Dispense: 30 capsule; Refill: [place], FNP

## 2019-01-14 ENCOUNTER — Ambulatory Visit (INDEPENDENT_AMBULATORY_CARE_PROVIDER_SITE_OTHER): Payer: Medicare Other | Admitting: Family Medicine

## 2019-01-14 ENCOUNTER — Encounter: Payer: Self-pay | Admitting: Family Medicine

## 2019-01-14 VITALS — BP 144/78 | HR 83 | Temp 98.0°F | Ht 59.0 in | Wt 96.0 lb

## 2019-01-14 DIAGNOSIS — J441 Chronic obstructive pulmonary disease with (acute) exacerbation: Secondary | ICD-10-CM | POA: Diagnosis not present

## 2019-01-14 MED ORDER — AZITHROMYCIN 250 MG PO TABS: ORAL_TABLET | ORAL | 0 refills | Status: DC

## 2019-01-14 NOTE — Progress Notes (Signed)
BP (!) 144/78   Pulse 83   Temp 98 F (36.7 C) (Oral)   Ht 4\' 11"  (1.499 m)   Wt 96 lb (43.5 kg)   LMP 02/24/1990 Comment: [facility] does pap,[name] CNM  SpO2 96%   BMI 19.39 kg/m    Subjective:    Patient ID: [name], female    DOB: Nov 13, 1951, 68 y.o.   MRN: [id number]  HPI: DEJANIQUE RUEHL is a 68 y.o. female presenting on 01/14/2019 for Sore Throat (x 1 week but has gotten worse); Cough; and Nasal Congestion   HPI Cough and sore throat and congestion and chest congestion  Patient is coming in today with cough and sore throat and congestion and chest congestion is been going on for 1 week.  She has been using DayQuil and NyQuil and does not feel like they are helping significantly.  She says it does not seem to be improving and that they are worsening.  She feels like her cough has been more productive recently and it is been worsening at nighttime as well and is keeping her up more at night.  This is what finally brought her in because she just does not seem to be improving.  She admits to some wheezing but denies any shortness of breath or fevers or chills.  She did smoke in the past and has at least a 40-pack-year history in the past.  Relevant past medical, surgical, family and social history reviewed and updated as indicated. Interim medical history since our last visit reviewed. Allergies and medications reviewed and updated.  Review of Systems  Constitutional: Negative for chills and fever.  HENT: Positive for congestion, postnasal drip and sore throat. Negative for ear discharge, ear pain, rhinorrhea, sinus pressure and sneezing.   Eyes: Negative for pain, redness and visual disturbance.  Respiratory: Positive for cough and wheezing. Negative for chest tightness and shortness of breath.   Cardiovascular: Negative for chest pain and leg swelling.  Genitourinary: Negative for difficulty urinating and dysuria.  Musculoskeletal: Negative for back pain and  gait problem.  Skin: Negative for rash.  Neurological: Negative for light-headedness and headaches.  Psychiatric/Behavioral: Negative for agitation and behavioral problems.  All other systems reviewed and are negative.   Per HPI unless specifically indicated above   Allergies as of 01/14/2019      Reactions   Codeine    Musk Oil Fragrance    hives   Walnuts [nuts]       Medication List       Accurate as of January 14, 2019 10:07 AM. Always use your most recent med list.        ASPIRIN PO Take by mouth. 2 daily   azithromycin 250 MG tablet Commonly known as:  ZITHROMAX Take 2 the first day and then one each day after.   BIOTIN PO Take by mouth daily.   calcium citrate 950 MG tablet Commonly known as:  CALCITRATE - dosed in mg elemental calcium Take 1 tablet by mouth daily.   multivitamin with minerals tablet Take 1 tablet by mouth daily.   Vitamin D 1000 units capsule Take 1,000 Units by mouth daily.          Objective:    BP (!) 144/78   Pulse 83   Temp 98 F (36.7 C) (Oral)   Ht 4\' 11"  (1.499 m)   Wt 96 lb (43.5 kg)   LMP 02/24/1990 Comment: [facility] does pap,[name] CNM  SpO2  96%   BMI 19.39 kg/m   Wt Readings from Last 3 Encounters:  01/14/19 96 lb (43.5 kg)  10/07/18 91 lb 12.8 oz (41.6 kg)  07/24/18 89 lb 3.2 oz (40.5 kg)    Physical Exam Vitals signs reviewed.  Constitutional:      General: She is not in acute distress.    Appearance: She is well-developed. She is not diaphoretic.  HENT:     Right Ear: Tympanic membrane, ear canal and external ear normal.     Left Ear: Tympanic membrane, ear canal and external ear normal.     Nose: Mucosal edema and rhinorrhea present.     Right Sinus: No maxillary sinus tenderness or frontal sinus tenderness.     Left Sinus: No maxillary sinus tenderness or frontal sinus tenderness.     Mouth/Throat:     Pharynx: Uvula midline. Posterior oropharyngeal erythema present. No  oropharyngeal exudate.     Tonsils: No tonsillar abscesses.  Eyes:     Conjunctiva/sclera: Conjunctivae normal.  Cardiovascular:     Rate and Rhythm: Normal rate and regular rhythm.     Heart sounds: Normal heart sounds. No murmur.  Pulmonary:     Effort: Pulmonary effort is normal. No respiratory distress.     Breath sounds: Normal breath sounds. No wheezing.  Musculoskeletal: Normal range of motion.        General: No tenderness.  Skin:    General: Skin is warm and dry.     Findings: No rash.  Neurological:     Mental Status: She is alert and oriented to person, place, and time.     Coordination: Coordination normal.  Psychiatric:        Behavior: Behavior normal.     Results for orders placed or performed in visit on 09/24/18  Cologuard  Result Value Ref Range   Cologuard Negative       Assessment & Plan:   Problem List Items Addressed This Visit    None    Visit Diagnoses    COPD exacerbation ([place])    -  Primary   Relevant Medications   azithromycin (ZITHROMAX) 250 MG tablet       Follow up plan: Return if symptoms worsen or fail to improve.  Counseling provided for all of the vaccine components No orders of the defined types were placed in this encounter.   Caryl Pina, MD [facility] 01/14/2019, 10:07 AM

## 2019-05-05 NOTE — Progress Notes (Signed)
68 y.o. G50P1001 Married  Caucasian Fe here for annual exam. Menopausal no HRT. Denies vaginal bleeding or vaginal dryness change. Still playing Golf 3 days a week.  Sees PCP yearly for aex and labs. Feels well and emotionally well. No health issues or concerns today.  Patient's last menstrual period was 02/24/1990.          Sexually active: No.  The current method of family planning is status post hysterectomy.    Exercising: Yes.    golf & yardwork Smoker:  no  Review of Systems  Constitutional: Negative.   HENT: Negative.   Eyes: Negative.   Respiratory: Negative.   Cardiovascular: Negative.   Gastrointestinal: Negative.   Genitourinary: Negative.   Musculoskeletal: Negative.   Skin: Negative.   Neurological: Negative.   Endo/Heme/Allergies: Negative.   Psychiatric/Behavioral: Negative.     Health Maintenance: Pap:  2013 neg History of Abnormal Pap: no MMG:  04-25-18 category c density birads 1:neg, today Self Breast exams: yes Colonoscopy:  2011 neg f/u 55yrs, did stool test with pcp BMD:   2017 f/u 2-60yrs TDaP:  2017 Shingles: no Pneumonia: 2019 Hep C and HIV: both neg 2017 Labs: PCP   reports that she quit smoking about 10 years ago. Her smoking use included cigarettes. She has a 10.50 pack-year smoking history. She has never used smokeless tobacco. She reports that she does not drink alcohol or use drugs.  Past Medical History:  Diagnosis Date  . COPD (chronic obstructive pulmonary disease) ([place])   . Endometriosis   . FH: lung cancer   . H/O blood clots    in legs  . Multiple allergies    pt states she took allergy shots x 20 yrs  . Tobacco abuse     Past Surgical History:  Procedure Laterality Date  . ABLATION ON ENDOMETRIOSIS    . SKIN LESION EXCISION    . TOTAL ABDOMINAL HYSTERECTOMY  4/91  . TUBAL LIGATION    . TUMOR REMOVAL      Current Outpatient Medications  Medication Sig Dispense Refill  . ASPIRIN PO Take by mouth daily.     Marland Kitchen BIOTIN PO Take  by mouth daily.    . calcium citrate (CALCITRATE - DOSED IN MG ELEMENTAL CALCIUM) 950 MG tablet Take 1 tablet by mouth daily.      . Cholecalciferol (VITAMIN D) 1000 UNITS capsule Take 1,000 Units by mouth daily.      . Multiple Vitamins-Minerals (MULTIVITAMIN WITH MINERALS) tablet Take 1 tablet by mouth daily.       No current facility-administered medications for this visit.     Family History  Problem Relation Age of Onset  . Heart attack Brother   . Diabetes Brother   . Kidney failure Brother        39  . Lung cancer Sister        w/ mets to brain  . Asthma Sister   . Crohn's disease Sister   . Cancer Sister        lung cancer  . Heart disease Mother   . Diabetes Mother     ROS:  Pertinent items are noted in HPI.  Otherwise, a comprehensive ROS was negative.  Exam:   BP 120/80   Pulse 70   Temp 97.6 F (36.4 C) (Skin)   Resp 16   Ht 4' 11.75" (1.518 m)   Wt 90 lb (40.8 kg)   LMP 02/24/1990 Comment: [facility] does pap,Deborah Hollice Espy CNM  BMI 17.72  kg/m  Height: 4' 11.75" (151.8 cm) Ht Readings from Last 3 Encounters:  05/06/19 4' 11.75" (1.518 m)  01/14/19 4\' 11"  (1.499 m)  10/07/18 4\' 11"  (1.499 m)    General appearance: alert, cooperative and appears stated age Head: Normocephalic, without obvious abnormality, atraumatic Neck: no adenopathy, supple, symmetrical, trachea midline and thyroid normal to inspection and palpation Lungs: clear to auscultation bilaterally Breasts: normal appearance, no masses or tenderness, No nipple retraction or dimpling, No nipple discharge or bleeding, No axillary or supraclavicular adenopathy Heart: regular rate and rhythm Abdomen: soft, non-tender; no masses,  no organomegaly Extremities: extremities normal, atraumatic, no cyanosis or edema Skin: Skin color, texture, turgor normal. No rashes or lesions Lymph nodes: Cervical, supraclavicular, and axillary nodes normal. No abnormal inguinal nodes palpated Neurologic:  Grossly normal   Pelvic: External genitalia:  no lesions, atrophic appearance              Urethra:  normal appearing urethra with no masses, tenderness or lesions              Bartholin's and Skene's: normal                 Vagina: atrophic appearing vagina with normal color and discharge, no lesions              Cervix: absent              Pap taken: No. Bimanual Exam:  Uterus:  uterus absent              Adnexa: no mass, fullness, tenderness               Rectovaginal: Confirms               Anus:  normal sphincter tone, no lesions  Chaperone present: yes  A:  Well Woman with normal exam  Post menopausal no HRT s/p TAH ovaries retained  Vaginal dryness using coconut oil as needed  BMD due will schedule with PCP  P:   Reviewed health and wellness pertinent to exam  Discussed if vaginal dryness changes to advise   Stressed importance of BMD, and staying active for good bone health.  Keep wellness visit with MD.  Pap smear: no   counseled on breast self exam, mammography screening, adequate intake of calcium and vitamin D, diet and exercise and need for BMD this year.   return annually or prn  An After Visit Summary was printed and given to the patient.

## 2019-05-06 ENCOUNTER — Encounter: Payer: Self-pay | Admitting: Certified Nurse Midwife

## 2019-05-06 ENCOUNTER — Ambulatory Visit: Payer: Medicare Other | Admitting: Certified Nurse Midwife

## 2019-05-06 ENCOUNTER — Other Ambulatory Visit: Payer: Self-pay

## 2019-05-06 ENCOUNTER — Other Ambulatory Visit: Payer: Self-pay | Admitting: Certified Nurse Midwife

## 2019-05-06 ENCOUNTER — Ambulatory Visit
Admission: RE | Admit: 2019-05-06 | Discharge: 2019-05-06 | Disposition: A | Discharge status: Home / Self Care | Payer: Medicare Other | Source: Ambulatory Visit | Attending: Certified Nurse Midwife | Admitting: Certified Nurse Midwife

## 2019-05-06 ENCOUNTER — Ambulatory Visit (INDEPENDENT_AMBULATORY_CARE_PROVIDER_SITE_OTHER): Payer: Medicare Other | Admitting: Certified Nurse Midwife

## 2019-05-06 VITALS — BP 120/80 | HR 70 | Temp 97.6°F | Resp 16 | Ht 59.75 in | Wt 90.0 lb

## 2019-05-06 DIAGNOSIS — Z1231 Encounter for screening mammogram for malignant neoplasm of breast: Secondary | ICD-10-CM

## 2019-05-06 DIAGNOSIS — Z01419 Encounter for gynecological examination (general) (routine) without abnormal findings: Secondary | ICD-10-CM | POA: Diagnosis not present

## 2019-05-06 DIAGNOSIS — Z780 Asymptomatic menopausal state: Secondary | ICD-10-CM

## 2019-07-27 ENCOUNTER — Encounter: Payer: Medicare Other | Admitting: Family

## 2019-08-05 ENCOUNTER — Other Ambulatory Visit: Payer: Self-pay

## 2019-08-06 ENCOUNTER — Ambulatory Visit (INDEPENDENT_AMBULATORY_CARE_PROVIDER_SITE_OTHER): Payer: Medicare Other | Admitting: Family

## 2019-08-06 ENCOUNTER — Encounter: Payer: Self-pay | Admitting: Family

## 2019-08-06 VITALS — BP 136/81 | HR 63 | Temp 97.8°F | Ht 59.5 in | Wt 89.4 lb

## 2019-08-06 DIAGNOSIS — J441 Chronic obstructive pulmonary disease with (acute) exacerbation: Secondary | ICD-10-CM | POA: Diagnosis not present

## 2019-08-06 DIAGNOSIS — Z0000 Encounter for general adult medical examination without abnormal findings: Secondary | ICD-10-CM

## 2019-08-06 DIAGNOSIS — Z0001 Encounter for general adult medical examination with abnormal findings: Secondary | ICD-10-CM | POA: Diagnosis not present

## 2019-08-06 NOTE — Progress Notes (Signed)
Subjective:    Patient ID: Catherine Jensen, female    DOB: 05-15-1951, 68 y.o.   MRN: [id number]  Chief Complaint  Patient presents with  . Annual Exam   HPI Catherine Jensen presents to the office today for CPE. Catherine Jensen currently not taking any prescription medications, but does take several vitamins. Catherine Jensen denies any headache, palpitations, SOB, or edema at this time. Catherine Jensen continues to play golf 3-4 times a week.   Catherine Jensen has COPD, but does not take any inhalers. Catherine Jensen quit smoking about 7-8 years ago.    Review of Systems  All other systems reviewed and are negative.   Family History  Problem Relation Age of Onset  . Heart attack Brother   . Diabetes Brother   . Kidney failure Brother        30  . Lung cancer Sister        w/ mets to brain  . Asthma Sister   . Crohn's disease Sister   . Cancer Sister        lung cancer  . Heart disease Mother   . Diabetes Mother    Social History   Socioeconomic History  . Marital status: Married    Spouse name: Not on file  . Number of children: 1  . Years of education: 62  . Highest education level: 12th grade  Occupational History  . Occupation: Armed forces training and education officer: [employer]: exposed to [employer] @ work  Scientific laboratory technician  . Financial resource strain: Not hard at all  . Food insecurity    Worry: Never true    Inability: Never true  . Transportation needs    Medical: No    Non-medical: No  Tobacco Use  . Smoking status: Former Smoker    Packs/day: 0.50    Years: 21.00    Pack years: 10.50    Types: Cigarettes    Quit date: 11/26/2008    Years since quitting: 10.6  . Smokeless tobacco: Never Used  Substance and Sexual Activity  . Alcohol use: No  . Drug use: No  . Sexual activity: Not Currently    Partners: Male    Comment: hysterectomy  Lifestyle  . Physical activity    Days per week: 5 days    Minutes per session: 60 min  . Stress: Not at all  Relationships  . Social connections    Talks on phone: More than three times a  week    Gets together: More than three times a week    Attends religious service: Never    Active member of club or organization: Yes    Attends meetings of clubs or organizations: More than 4 times per year    Relationship status: Married  Other Topics Concern  . Not on file  Social History Narrative  . Not on file       Objective:   Physical Exam Vitals signs reviewed.  Constitutional:      General: Catherine Jensen is not in acute distress.    Appearance: Catherine Jensen is well-developed.  HENT:     Head: Normocephalic and atraumatic.     Right Ear: Tympanic membrane normal.     Left Ear: Tympanic membrane normal.  Eyes:     Pupils: Pupils are equal, round, and reactive to light.  Neck:     Musculoskeletal: Normal range of motion and neck supple.     Thyroid: No thyromegaly.  Cardiovascular:     Rate and Rhythm:  Normal rate and regular rhythm.     Heart sounds: Murmur present.  Pulmonary:     Effort: Pulmonary effort is normal. No respiratory distress.     Breath sounds: Wheezing present.  Abdominal:     General: Bowel sounds are normal. There is no distension.     Palpations: Abdomen is soft.     Tenderness: There is no abdominal tenderness.  Musculoskeletal: Normal range of motion.        General: No tenderness.  Skin:    General: Skin is warm and dry.  Neurological:     Mental Status: Catherine Jensen is alert and oriented to person, place, and time.     Cranial Nerves: No cranial nerve deficit.     Deep Tendon Reflexes: Reflexes are normal and symmetric.  Psychiatric:        Behavior: Behavior normal.        Thought Content: Thought content normal.        Judgment: Judgment normal.       BP 136/81   Pulse 63   Temp 97.8 F (36.6 C) (Temporal)   Ht 4' 11.5" (1.511 m)   Wt 89 lb 6.4 oz (40.6 kg)   LMP 02/24/1990 Comment: [facility] does pap,[name] CNM  BMI 17.75 kg/m      Assessment & Plan:  Catherine Jensen comes in today with chief complaint of Annual Exam    Diagnosis and orders addressed:  1. Annual physical exam - CMP14+EGFR - CBC with Differential/Platelet - Lipid panel - TSH  2. Chronic obstructive pulmonary disease with acute exacerbation (HCC) - CMP14+EGFR - CBC with Differential/Platelet   Labs pending Health Maintenance reviewed Diet and exercise encouraged  Follow up plan: 1 year   [name], FNP

## 2019-08-06 NOTE — Patient Instructions (Signed)
Health Maintenance After Age 67 After age 67, you are at a higher risk for certain long-term diseases and infections as well as injuries from falls. Falls are a major cause of broken bones and head injuries in people who are older than age 67. Getting regular preventive care can help to keep you healthy and well. Preventive care includes getting regular testing and making lifestyle changes as recommended by your health care provider. Talk with your health care provider about:  Which screenings and tests you should have. A screening is a test that checks for a disease when you have no symptoms.  A diet and exercise plan that is right for you. What should I know about screenings and tests to prevent falls? Screening and testing are the best ways to find a health problem early. Early diagnosis and treatment give you the best chance of managing medical conditions that are common after age 67. Certain conditions and lifestyle choices may make you more likely to have a fall. Your health care provider may recommend:  Regular vision checks. Poor vision and conditions such as cataracts can make you more likely to have a fall. If you wear glasses, make sure to get your prescription updated if your vision changes.  Medicine review. Work with your health care provider to regularly review all of the medicines you are taking, including over-the-counter medicines. Ask your health care provider about any side effects that may make you more likely to have a fall. Tell your health care provider if any medicines that you take make you feel dizzy or sleepy.  Osteoporosis screening. Osteoporosis is a condition that causes the bones to get weaker. This can make the bones weak and cause them to break more easily.  Blood pressure screening. Blood pressure changes and medicines to control blood pressure can make you feel dizzy.  Strength and balance checks. Your health care provider may recommend certain tests to check your  strength and balance while standing, walking, or changing positions.  Foot health exam. Foot pain and numbness, as well as not wearing proper footwear, can make you more likely to have a fall.  Depression screening. You may be more likely to have a fall if you have a fear of falling, feel emotionally low, or feel unable to do activities that you used to do.  Alcohol use screening. Using too much alcohol can affect your balance and may make you more likely to have a fall. What actions can I take to lower my risk of falls? General instructions  Talk with your health care provider about your risks for falling. Tell your health care provider if: ? You fall. Be sure to tell your health care provider about all falls, even ones that seem minor. ? You feel dizzy, sleepy, or off-balance.  Take over-the-counter and prescription medicines only as told by your health care provider. These include any supplements.  Eat a healthy diet and maintain a healthy weight. A healthy diet includes low-fat dairy products, low-fat (lean) meats, and fiber from whole grains, beans, and lots of fruits and vegetables. Home safety  Remove any tripping hazards, such as rugs, cords, and clutter.  Install safety equipment such as grab bars in bathrooms and safety rails on stairs.  Keep rooms and walkways well-lit. Activity   Follow a regular exercise program to stay fit. This will help you maintain your balance. Ask your health care provider what types of exercise are appropriate for you.  If you need a cane or   walker, use it as recommended by your health care provider.  Wear supportive shoes that have nonskid soles. Lifestyle  Do not drink alcohol if your health care provider tells you not to drink.  If you drink alcohol, limit how much you have: ? 0-1 drink a day for women. ? 0-2 drinks a day for men.  Be aware of how much alcohol is in your drink. In the [place] one drink equals one typical bottle of beer (12  oz), one-half glass of wine (5 oz), or one shot of hard liquor (1 oz).  Do not use any products that contain nicotine or tobacco, such as cigarettes and e-cigarettes. If you need help quitting, ask your health care provider. Summary  Having a healthy lifestyle and getting preventive care can help to protect your health and wellness after age 67.  Screening and testing are the best way to find a health problem early and help you avoid having a fall. Early diagnosis and treatment give you the best chance for managing medical conditions that are more common for people who are older than age 67.  Falls are a major cause of broken bones and head injuries in people who are older than age 67. Take precautions to prevent a fall at home.  Work with your health care provider to learn what changes you can make to improve your health and wellness and to prevent falls. This information is not intended to replace advice given to you by your health care provider. Make sure you discuss any questions you have with your health care provider. Document Released: 09/25/2017 Document Revised: 03/05/2019 Document Reviewed: 09/25/2017 Elsevier Patient Education  2020 Elsevier Inc.  

## 2019-08-07 LAB — CMP14+EGFR
ALT: 16 IU/L (ref 0–32)
AST: 24 IU/L (ref 0–40)
Albumin/Globulin Ratio: 2.2 (ref 1.2–2.2)
Albumin: 4.9 g/dL — ABNORMAL HIGH (ref 3.8–4.8)
Alkaline Phosphatase: 85 IU/L (ref 39–117)
BUN/Creatinine Ratio: 14 (ref 12–28)
BUN: 10 mg/dL (ref 8–27)
Bilirubin Total: 0.3 mg/dL (ref 0.0–1.2)
CO2: 30 mmol/L — ABNORMAL HIGH (ref 20–29)
Calcium: 11 mg/dL — ABNORMAL HIGH (ref 8.7–10.3)
Chloride: 100 mmol/L (ref 96–106)
Creatinine, Ser: 0.69 mg/dL (ref 0.57–1.00)
GFR calc Af Amer: 104 mL/min/{1.73_m2} (ref >59)
GFR calc non Af Amer: 90 mL/min/{1.73_m2} (ref >59)
Globulin, Total: 2.2 g/dL (ref 1.5–4.5)
Glucose: 72 mg/dL (ref 65–99)
Potassium: 4.8 mmol/L (ref 3.5–5.2)
Sodium: 140 mmol/L (ref 134–144)
Total Protein: 7.1 g/dL (ref 6.0–8.5)

## 2019-08-07 LAB — LIPID PANEL
Chol/HDL Ratio: 2.6 ratio (ref 0.0–4.4)
Cholesterol, Total: 205 mg/dL — ABNORMAL HIGH (ref 100–199)
HDL: 80 mg/dL (ref >39)
LDL Chol Calc (NIH): 108 mg/dL — ABNORMAL HIGH (ref 0–99)
Triglycerides: 95 mg/dL (ref 0–149)
VLDL Cholesterol Cal: 17 mg/dL (ref 5–40)

## 2019-08-07 LAB — CBC WITH DIFFERENTIAL/PLATELET
Basophils Absolute: 0.1 10*3/uL (ref 0.0–0.2)
Basos: 1 %
EOS (ABSOLUTE): 0.4 10*3/uL (ref 0.0–0.4)
Eos: 8 %
Hematocrit: 46.7 % — ABNORMAL HIGH (ref 34.0–46.6)
Hemoglobin: 15.7 g/dL (ref 11.1–15.9)
Immature Grans (Abs): 0 10*3/uL (ref 0.0–0.1)
Immature Granulocytes: 0 %
Lymphocytes Absolute: 1.4 10*3/uL (ref 0.7–3.1)
Lymphs: 26 %
MCH: 31.7 pg (ref 26.6–33.0)
MCHC: 33.6 g/dL (ref 31.5–35.7)
MCV: 94 fL (ref 79–97)
Monocytes Absolute: 0.4 10*3/uL (ref 0.1–0.9)
Monocytes: 7 %
Neutrophils Absolute: 3.2 10*3/uL (ref 1.4–7.0)
Neutrophils: 58 %
Platelets: 234 10*3/uL (ref 150–450)
RBC: 4.95 x10E6/uL (ref 3.77–5.28)
RDW: 11.2 % — ABNORMAL LOW (ref 11.7–15.4)
WBC: 5.5 10*3/uL (ref 3.4–10.8)

## 2019-08-07 LAB — TSH: TSH: 0.903 u[IU]/mL (ref 0.450–4.500)

## 2019-08-18 ENCOUNTER — Encounter: Payer: Self-pay | Admitting: *Deleted

## 2019-10-13 ENCOUNTER — Telehealth: Payer: Self-pay | Admitting: Family

## 2019-10-14 ENCOUNTER — Ambulatory Visit (INDEPENDENT_AMBULATORY_CARE_PROVIDER_SITE_OTHER): Payer: Medicare Other

## 2019-10-14 ENCOUNTER — Other Ambulatory Visit: Payer: Self-pay

## 2019-10-14 DIAGNOSIS — Z23 Encounter for immunization: Secondary | ICD-10-CM | POA: Diagnosis not present

## 2019-11-18 ENCOUNTER — Ambulatory Visit (INDEPENDENT_AMBULATORY_CARE_PROVIDER_SITE_OTHER): Payer: Medicare Other | Admitting: *Deleted

## 2019-11-18 DIAGNOSIS — Z0000 Encounter for general adult medical examination without abnormal findings: Secondary | ICD-10-CM

## 2019-11-18 NOTE — Patient Instructions (Signed)
Preventive Care 67 Years and Older, Female Preventive care refers to lifestyle choices and visits with your health care provider that can promote health and wellness. This includes:  A yearly physical exam. This is also called an annual well check.  Regular dental and eye exams.  Immunizations.  Screening for certain conditions.  Healthy lifestyle choices, such as diet and exercise. What can I expect for my preventive care visit? Physical exam Your health care provider will check:  Height and weight. These may be used to calculate body mass index (BMI), which is a measurement that tells if you are at a healthy weight.  Heart rate and blood pressure.  Your skin for abnormal spots. Counseling Your health care provider may ask you questions about:  Alcohol, tobacco, and drug use.  Emotional well-being.  Home and relationship well-being.  Sexual activity.  Eating habits.  History of falls.  Memory and ability to understand (cognition).  Work and work Statistician.  Pregnancy and menstrual history. What immunizations do I need?  Influenza (flu) vaccine  This is recommended every year. Tetanus, diphtheria, and pertussis (Tdap) vaccine  You may need a Td booster every 10 years. Varicella (chickenpox) vaccine  You may need this vaccine if you have not already been vaccinated. Zoster (shingles) vaccine  You may need this after age 67. Pneumococcal conjugate (PCV13) vaccine  One dose is recommended after age 67. Pneumococcal polysaccharide (PPSV23) vaccine  One dose is recommended after age 67. Measles, mumps, and rubella (MMR) vaccine  You may need at least one dose of MMR if you were born in 1957 or later. You may also need a second dose. Meningococcal conjugate (MenACWY) vaccine  You may need this if you have certain conditions. Hepatitis A vaccine  You may need this if you have certain conditions or if you travel or work in places where you may be exposed  to hepatitis A. Hepatitis B vaccine  You may need this if you have certain conditions or if you travel or work in places where you may be exposed to hepatitis B. Haemophilus influenzae type b (Hib) vaccine  You may need this if you have certain conditions. You may receive vaccines as individual doses or as more than one vaccine together in one shot (combination vaccines). Talk with your health care provider about the risks and benefits of combination vaccines. What tests do I need? Blood tests  Lipid and cholesterol levels. These may be checked every 5 years, or more frequently depending on your overall health.  Hepatitis C test.  Hepatitis B test. Screening  Lung cancer screening. You may have this screening every year starting at age 67 if you have a 30-pack-year history of smoking and currently smoke or have quit within the past 15 years.  Colorectal cancer screening. All adults should have this screening starting at age 67 and continuing until age 15. Your health care provider may recommend screening at age 67 if you are at increased risk. You will have tests every 1-10 years, depending on your results and the type of screening test.  Diabetes screening. This is done by checking your blood sugar (glucose) after you have not eaten for a while (fasting). You may have this done every 1-3 years.  Mammogram. This may be done every 1-2 years. Talk with your health care provider about how often you should have regular mammograms.  BRCA-related cancer screening. This may be done if you have a family history of breast, ovarian, tubal, or peritoneal cancers.  Other tests  Sexually transmitted disease (STD) testing.  Bone density scan. This is done to screen for osteoporosis. You may have this done starting at age 67. Follow these instructions at home: Eating and drinking  Eat a diet that includes fresh fruits and vegetables, whole grains, lean protein, and low-fat dairy products. Limit  your intake of foods with high amounts of sugar, saturated fats, and salt.  Take vitamin and mineral supplements as recommended by your health care provider.  Do not drink alcohol if your health care provider tells you not to drink.  If you drink alcohol: ? Limit how much you have to 0-1 drink a day. ? Be aware of how much alcohol is in your drink. In the [place] one drink equals one 12 oz bottle of beer (355 mL), one 5 oz glass of wine (148 mL), or one 1 oz glass of hard liquor (44 mL). Lifestyle  Take daily care of your teeth and gums.  Stay active. Exercise for at least 30 minutes on 5 or more days each week.  Do not use any products that contain nicotine or tobacco, such as cigarettes, e-cigarettes, and chewing tobacco. If you need help quitting, ask your health care provider.  If you are sexually active, practice safe sex. Use a condom or other form of protection in order to prevent STIs (sexually transmitted infections).  Talk with your health care provider about taking a low-dose aspirin or statin. What's next?  Go to your health care provider once a year for a well check visit.  Ask your health care provider how often you should have your eyes and teeth checked.  Stay up to date on all vaccines. This information is not intended to replace advice given to you by your health care provider. Make sure you discuss any questions you have with your health care provider. Document Released: 12/09/2015 Document Revised: 11/06/2018 Document Reviewed: 11/06/2018 Elsevier Patient Education  2020 [employer].

## 2019-11-18 NOTE — Progress Notes (Signed)
MEDICARE ANNUAL WELLNESS VISIT  11/18/2019  Telephone Visit Disclaimer This Medicare AWV was conducted by telephone due to national recommendations for restrictions regarding the COVID-19 Pandemic (e.g. social distancing).  I verified, using two identifiers, that I am speaking with Catherine Jensen or their authorized healthcare agent. I discussed the limitations, risks, security, and privacy concerns of performing an evaluation and management service by telephone and the potential availability of an in-person appointment in the future. The patient expressed understanding and agreed to proceed.   Subjective:  Catherine Jensen is a 68 y.o. female patient of Hawks, Theador Hawthorne, FNP who had a Medicare Annual Wellness Visit today via telephone. Catherine Jensen is Retired and lives with their spouse. she has 1 child. she reports that she is socially active and does interact with friends/family regularly. she is minimally physically active and enjoys working in her yard, watching TV and playing golf at least 2-3 times a week.  Patient Care Team: Sharion Balloon, FNP as PCP - General (Family Medicine) Selinda Orion, MD (Inactive) as Referring Physician (Obstetrics and Gynecology)  Advanced Directives 11/18/2019 07/09/2018 01/09/2017  Does Patient Have a Medical Advance Directive? No No No  Would patient like information on creating a medical advance directive? No - Patient declined No - Patient declined No - Patient declined    Hospital Utilization Over the Past 12 Months: # of hospitalizations or ER visits: 0 # of surgeries: 0  Review of Systems    Patient reports that her overall health is unchanged compared to last year.  History obtained from chart review  Patient Reported Readings (BP, Pulse, CBG, Weight, etc) none  Pain Assessment Pain : No/denies pain     Current Medications & Allergies (verified) Allergies as of 11/18/2019      Reactions   Codeine    Musk Oil Fragrance    hives   Walnuts [nuts]       Medication List       Accurate as of November 18, 2019  9:46 AM. If you have any questions, ask your nurse or doctor.        ASPIRIN PO Take by mouth daily.   BIOTIN PO Take by mouth daily.   calcium citrate 950 (200 Ca) MG tablet Commonly known as: CALCITRATE - dosed in mg elemental calcium Take 1 tablet by mouth daily.   multivitamin with minerals tablet Take 1 tablet by mouth daily.   Vitamin D 1000 units capsule Take 1,000 Units by mouth daily.       History (reviewed): Past Medical History:  Diagnosis Date  . COPD (chronic obstructive pulmonary disease) ([place])   . Endometriosis   . FH: lung cancer   . H/O blood clots    in legs  . Multiple allergies    pt states she took allergy shots x 20 yrs  . Tobacco abuse    Past Surgical History:  Procedure Laterality Date  . ABLATION ON ENDOMETRIOSIS    . SKIN LESION EXCISION    . TOTAL ABDOMINAL HYSTERECTOMY  4/91  . TUBAL LIGATION    . TUMOR REMOVAL     Family History  Problem Relation Age of Onset  . Heart attack Brother   . Diabetes Brother   . Kidney failure Brother        72  . Lung cancer Sister        w/ mets to brain  . Asthma Sister   . Crohn's disease Sister   .  Cancer Sister        lung cancer  . Heart disease Mother   . Diabetes Mother    Social History   Socioeconomic History  . Marital status: Married    Spouse name: Catherine Jensen  . Number of children: 1  . Years of education: 18  . Highest education level: 12th grade  Occupational History  . Occupation: Armed forces training and education officer: [employer]    Comment: exposed to [employer] @ work  Tobacco Use  . Smoking status: Former Smoker    Packs/day: 0.50    Years: 21.00    Pack years: 10.50    Types: Cigarettes    Quit date: 11/26/2008    Years since quitting: 10.9  . Smokeless tobacco: Never Used  Substance and Sexual Activity  . Alcohol use: No  . Drug use: No  . Sexual activity: Not Currently    Partners: Male     Birth control/protection: Surgical    Comment: hysterectomy  Other Topics Concern  . Not on file  Social History Narrative  . Not on file   Social Determinants of Health   Financial Resource Strain: Low Risk   . Difficulty of Paying Living Expenses: Not hard at all  Food Insecurity: No Food Insecurity  . Worried About Charity fundraiser in the Last Year: Never true  . Ran Out of Food in the Last Year: Never true  Transportation Needs: No Transportation Needs  . Lack of Transportation (Medical): No  . Lack of Transportation (Non-Medical): No  Physical Activity: Inactive  . Days of Exercise per Week: 0 days  . Minutes of Exercise per Session: 0 min  Stress: No Stress Concern Present  . Feeling of Stress : Not at all  Social Connections: Somewhat Isolated  . Frequency of Communication with Friends and Family: More than three times a week  . Frequency of Social Gatherings with Friends and Family: More than three times a week  . Attends Religious Services: Never  . Active Member of Clubs or Organizations: No  . Attends Archivist Meetings: Never  . Marital Status: Married    Activities of Daily Living In your present state of health, do you have any difficulty performing the following activities: 11/18/2019  Hearing? N  Vision? N  Difficulty concentrating or making decisions? N  Walking or climbing stairs? N  Dressing or bathing? N  Doing errands, shopping? N  Preparing Food and eating ? N  Using the Toilet? N  In the past six months, have you accidently leaked urine? N  Do you have problems with loss of bowel control? N  Managing your Medications? N  Managing your Finances? N  Housekeeping or managing your Housekeeping? N  Some recent data might be hidden    Patient Education/ Literacy How often do you need to have someone help you when you read instructions, pamphlets, or other written materials from your doctor or pharmacy?: 1 - Never What is the last grade  level you completed in school?: 12th grade  Exercise Current Exercise Habits: The patient does not participate in regular exercise at present, Exercise limited by: respiratory conditions(s)  Diet Patient reports consuming 3 meals a day and 0 snack(s) a day Patient reports that her primary diet is: Regular Patient reports that she does have regular access to food.   Depression Screen PHQ 2/9 Scores 11/18/2019 08/06/2019 01/14/2019 10/07/2018 07/24/2018 07/09/2018 03/25/2018  PHQ - 2 Score 0 0 0 0 0 0  0     Fall Risk Fall Risk  11/18/2019 08/06/2019 01/14/2019 10/07/2018 07/24/2018  Falls in the past year? 0 0 0 0 No     Objective:  Catherine Jensen seemed alert and oriented and she participated appropriately during our telephone visit.  Blood Pressure Weight BMI  BP Readings from Last 3 Encounters:  08/06/19 136/81  05/06/19 120/80  01/14/19 (!) 144/78   Wt Readings from Last 3 Encounters:  08/06/19 89 lb 6.4 oz (40.6 kg)  05/06/19 90 lb (40.8 kg)  01/14/19 96 lb (43.5 kg)   BMI Readings from Last 1 Encounters:  08/06/19 17.75 kg/m    *Unable to obtain current vital signs, weight, and BMI due to telephone visit type  Hearing/Vision  . Janani did not seem to have difficulty with hearing/understanding during the telephone conversation . Reports that she has not had a formal eye exam by an eye care professional within the past year . Reports that she has not had a formal hearing evaluation within the past year *Unable to fully assess hearing and vision during telephone visit type  Cognitive Function: 6CIT Screen 11/18/2019  What Year? 0 points  What month? 0 points  What time? 0 points  Count back from 20 0 points  Months in reverse 2 points  Repeat phrase 0 points  Total Score 2   (Normal:0-7, Significant for Dysfunction: >8)  Normal Cognitive Function Screening: Yes   Immunization & Health Maintenance Record Immunization History  Administered Date(s) Administered  .  Influenza, High Dose Seasonal PF 09/29/2018  . Influenza,inj,Quad PF,6+ Mos 09/14/2013, 01/09/2017, 09/04/2017, 10/14/2019  . Pneumococcal Conjugate-13 01/09/2017  . Pneumococcal Polysaccharide-23 05/23/2011, 07/24/2018  . Pneumococcal-Unspecified 04/08/2013  . Tdap 04/18/2016    Health Maintenance  Topic Date Due  . MAMMOGRAM  05/05/2021  . Fecal DNA (Cologuard)  08/19/2021  . TETANUS/TDAP  04/18/2026  . INFLUENZA VACCINE  Completed  . DEXA SCAN  Completed  . Hepatitis C Screening  Completed  . PNA vac Low Risk Adult  Completed       Assessment  This is a routine wellness examination for Catherine Jensen.  Health Maintenance: Due or Overdue There are no preventive care reminders to display for this patient.  Catherine Jensen does not need a referral for Community Assistance: Care Management:   no Social Work:    no Prescription Assistance:  no Nutrition/Diabetes Education:  no   Plan:  Personalized Goals Goals Addressed            This Visit's Progress   . DIET - INCREASE WATER INTAKE       Try to drink 6-8 glasses of water daily      Personalized Health Maintenance & Screening Recommendations  Shingles vaccine  Lung Cancer Screening Recommended: no (Low Dose CT Chest recommended if Age 38-80 years, 30 pack-year currently smoking OR have quit w/in past 15 years) Hepatitis C Screening recommended: no HIV Screening recommended: no  Advanced Directives: Written information was not prepared per patient's request.  Referrals & Orders No orders of the defined types were placed in this encounter.   Follow-up Plan . Follow-up with Sharion Balloon, FNP as planned . Consider Shingles vaccine at your next visit with your PCP   I have personally reviewed and noted the following in the patient's chart:   . Medical and social history . Use of alcohol, tobacco or illicit drugs  . Current medications and supplements . Functional ability and status . Nutritional  status . Physical activity . Advanced directives . List of other physicians . Hospitalizations, surgeries, and ER visits in previous 12 months . Vitals . Screenings to include cognitive, depression, and falls . Referrals and appointments  In addition, I have reviewed and discussed with Catherine Jensen certain preventive protocols, quality metrics, and best practice recommendations. A written personalized care plan for preventive services as well as general preventive health recommendations is available and can be mailed to the patient at her request.      Milas Hock, LPN  06/16/8287

## 2020-02-17 ENCOUNTER — Encounter: Payer: Self-pay | Admitting: Certified Nurse Midwife

## 2020-03-07 ENCOUNTER — Ambulatory Visit (INDEPENDENT_AMBULATORY_CARE_PROVIDER_SITE_OTHER): Payer: Medicare Other | Admitting: Family Medicine

## 2020-03-07 DIAGNOSIS — L249 Irritant contact dermatitis, unspecified cause: Secondary | ICD-10-CM | POA: Diagnosis not present

## 2020-03-07 MED ORDER — PREDNISONE 10 MG PO TABS: ORAL_TABLET | ORAL | 0 refills | Status: DC

## 2020-03-07 NOTE — Progress Notes (Signed)
    Subjective:    Patient ID: Catherine Jensen, female    DOB: 1951-08-09, 69 y.o.   MRN: [id number]   HPI: Catherine Jensen is a 69 y.o. female presenting for poison oak on legs and at ankles and behind knee. Also behind he right ear. Using benadryl  And clalmine due to severe itch. They are helping. Onset 4 days ago. Continuous spread since then. Known exposure the day before onset.    Depression screen [facility] 2/9 11/18/2019 08/06/2019 01/14/2019 10/07/2018 07/24/2018  Decreased Interest 0 0 0 0 0  Down, Depressed, Hopeless 0 0 0 0 0  PHQ - 2 Score 0 0 0 0 0     Relevant past medical, surgical, family and social history reviewed and updated as indicated.  Interim medical history since our last visit reviewed. Allergies and medications reviewed and updated.  ROS:  Review of Systems  Constitutional: Negative for appetite change, chills, fatigue and fever.  HENT: Negative for congestion, postnasal drip and rhinorrhea.   Respiratory: Negative for cough and shortness of breath.   Cardiovascular: Negative for chest pain.  Gastrointestinal: Negative for abdominal pain.  Musculoskeletal: Negative for arthralgias.  Skin: Negative for rash.     Social History   Tobacco Use  Smoking Status Former Smoker  . Packs/day: 0.50  . Years: 21.00  . Pack years: 10.50  . Types: Cigarettes  . Quit date: 11/26/2008  . Years since quitting: 11.2  Smokeless Tobacco Never Used       Objective:     Wt Readings from Last 3 Encounters:  08/06/19 89 lb 6.4 oz (40.6 kg)  05/06/19 90 lb (40.8 kg)  01/14/19 96 lb (43.5 kg)     Exam deferred. Pt. Harboring due to COVID 19. Phone visit performed.   Assessment & Plan:  No diagnosis found.  Meds ordered this encounter  Medications  . predniSONE (DELTASONE) 10 MG tablet    Sig: Take 5 daily for 2 days followed by 4,3,2 and 1 for 2 days each.    Dispense:  30 tablet    Refill:  0    No orders of the defined types were placed in this encounter.      There are no diagnoses linked to this encounter.  Virtual Visit via telephone Note  I discussed the limitations, risks, security and privacy concerns of performing an evaluation and management service by telephone and the availability of in person appointments. The patient was identified with two identifiers. Pt.expressed understanding and agreed to proceed. Pt. Is at home. Dr. Livia Snellen is in his office.  Follow Up Instructions:   I discussed the assessment and treatment plan with the patient. The patient was provided an opportunity to ask questions and all were answered. The patient agreed with the plan and demonstrated an understanding of the instructions.   The patient was advised to call back or seek an in-person evaluation if the symptoms worsen or if the condition fails to improve as anticipated.   Total minutes including chart review and phone contact time: 9   Follow up plan: No follow-ups on file.  Claretta Fraise, MD [place]

## 2020-05-11 ENCOUNTER — Ambulatory Visit: Payer: Medicare Other | Admitting: Certified Nurse Midwife

## 2020-05-11 ENCOUNTER — Ambulatory Visit: Payer: Medicare Other

## 2020-05-11 ENCOUNTER — Ambulatory Visit: Payer: Medicare Other | Admitting: Obstetrics and Gynecology

## 2020-06-27 ENCOUNTER — Other Ambulatory Visit: Payer: Self-pay

## 2020-06-27 ENCOUNTER — Ambulatory Visit
Admission: RE | Admit: 2020-06-27 | Discharge: 2020-06-27 | Disposition: A | Discharge status: Home / Self Care | Payer: Medicare Other | Source: Ambulatory Visit | Attending: Certified Nurse Midwife | Admitting: Certified Nurse Midwife

## 2020-06-27 DIAGNOSIS — Z1231 Encounter for screening mammogram for malignant neoplasm of breast: Secondary | ICD-10-CM

## 2020-08-29 ENCOUNTER — Encounter: Payer: Self-pay | Admitting: Obstetrics and Gynecology

## 2020-08-29 ENCOUNTER — Ambulatory Visit (INDEPENDENT_AMBULATORY_CARE_PROVIDER_SITE_OTHER): Payer: Medicare Other | Admitting: Obstetrics and Gynecology

## 2020-08-29 ENCOUNTER — Other Ambulatory Visit: Payer: Self-pay

## 2020-08-29 VITALS — BP 144/80 | HR 60 | Resp 18 | Ht 59.0 in | Wt 88.6 lb

## 2020-08-29 DIAGNOSIS — Z01419 Encounter for gynecological examination (general) (routine) without abnormal findings: Secondary | ICD-10-CM | POA: Diagnosis not present

## 2020-08-29 NOTE — Patient Instructions (Signed)

## 2020-08-29 NOTE — Progress Notes (Signed)
69 y.o. G34P1001 Married Caucasian female here for annual exam.     Patient was told by PCP last year her calcium was elevated but not told what to do about it.  Drinks caffeinated coffee, 3 - 4 in am, 3 - 4 at night.  Voiding often.   Worked for [employer] for 66 years.  Now retired.  Plays golf. Husband had strokes and patient cares for him.   Vaccinated against Covid.   PCP:  [name], FNP  Patient's last menstrual period was 02/24/1990.           Sexually active: No.  The current method of family planning is status post hysterectomy.    Exercising: Yes.    plays golf 3x/week Smoker:  no  Health Maintenance: Pap: 06-09-12 Neg History of abnormal Pap:  no MMG: 06-27-20  3D/Neg/density C/BiRads1 Colonoscopy:  08-19-18 Cologuard Negative. 2011 colon Neg BMD: 2017 Result :Spine normal;hips osteopenic TDaP:  2017 Gardasil:   no HIV:04-18-16 NR Hep C:04-18-16 Neg Screening Labs:  PCP.   reports that she quit smoking about 11 years ago. Her smoking use included cigarettes. She has a 10.50 pack-year smoking history. She has never used smokeless tobacco. She reports that she does not drink alcohol and does not use drugs.  Past Medical History:  Diagnosis Date  . COPD (chronic obstructive pulmonary disease) ([place])   . Endometriosis   . FH: lung cancer   . H/O blood clots    in legs  . Multiple allergies    pt states she took allergy shots x 20 yrs  . Tobacco abuse     Past Surgical History:  Procedure Laterality Date  . ABLATION ON ENDOMETRIOSIS    . SKIN LESION EXCISION    . TOTAL ABDOMINAL HYSTERECTOMY  4/91  . TUBAL LIGATION    . TUMOR REMOVAL      Current Outpatient Medications  Medication Sig Dispense Refill  . aspirin 325 MG tablet Take 325 mg by mouth daily.    Marland Kitchen BIOTIN PO Take by mouth daily.    . Cholecalciferol (VITAMIN D-3) 125 MCG (5000 UT) TABS Take 1 capsule by mouth daily.    Marland Kitchen Lysine 1000 MG TABS Take 1 tablet by mouth daily.    . Multiple  Vitamins-Minerals (MULTIVITAMIN WITH MINERALS) tablet Take 1 tablet by mouth daily.       No current facility-administered medications for this visit.    Family History  Problem Relation Age of Onset  . Heart attack Brother   . Diabetes Brother   . Kidney failure Brother        37  . Lung cancer Sister        w/ mets to brain  . Asthma Sister   . Crohn's disease Sister   . Cancer Sister        lung cancer  . Heart disease Mother   . Diabetes Mother     Review of Systems  All other systems reviewed and are negative.   Exam:   BP (!) 144/80   Pulse 60   Resp 18   Ht 4\' 11"  (1.499 m)   Wt 88 lb 9.6 oz (40.2 kg)   LMP 02/24/1990 Comment: [facility] does pap,[name] CNM  BMI 17.90 kg/m     General appearance: alert, cooperative and appears stated age Head: normocephalic, without obvious abnormality, atraumatic Neck: no adenopathy, supple, symmetrical, trachea midline and thyroid normal to inspection and palpation Lungs: clear to auscultation bilaterally Breasts: normal appearance,  no masses or tenderness, No nipple retraction or dimpling, No nipple discharge or bleeding, No axillary adenopathy Heart: regular rate and rhythm Abdomen: soft, non-tender; no masses, no organomegaly Extremities: extremities normal, atraumatic, no cyanosis or edema Skin: skin color, texture, turgor normal. No rashes or lesions Lymph nodes: cervical, supraclavicular, and axillary nodes normal. Neurologic: grossly normal  Pelvic: External genitalia:  no lesions              No abnormal inguinal nodes palpated.              Urethra:  normal appearing urethra with no masses, tenderness or lesions              Bartholins and Skenes: normal                 Vagina: normal appearing vagina with normal color and discharge, no lesions.  Atrophy noted.               Cervix: Absent              Pap taken: No. Bimanual Exam:  Uterus:  Absent.              Adnexa: no mass, fullness,  tenderness              Rectal exam: Yes.  .  Confirms.              Anus:  normal sphincter tone, no lesions  Chaperone was present for exam.  Assessment:   Well woman visit with normal exam. Status post TAH.  Ovaries remain.  Atrophy.  Osteopenia.  Elevated calcium. Heavy caffeine use.  Hx blood clot in legs.   Plan: Mammogram screening discussed. Self breast awareness reviewed. Pap and HR HPV as above. Guidelines for Calcium, Vitamin D, regular exercise program including cardiovascular and weight bearing exercise. Follow up with PCP for her labs and potential bone density.  I discussed cooking oil for vaginal hydration.  I recommended she decrease her caffeine intake.  Follow up annually and prn.   After visit summary provided.

## 2020-09-01 ENCOUNTER — Ambulatory Visit (INDEPENDENT_AMBULATORY_CARE_PROVIDER_SITE_OTHER): Payer: Medicare Other

## 2020-09-01 ENCOUNTER — Other Ambulatory Visit: Payer: Self-pay

## 2020-09-01 DIAGNOSIS — Z23 Encounter for immunization: Secondary | ICD-10-CM | POA: Diagnosis not present

## 2020-11-21 ENCOUNTER — Ambulatory Visit (INDEPENDENT_AMBULATORY_CARE_PROVIDER_SITE_OTHER): Payer: Medicare Other

## 2020-11-21 DIAGNOSIS — Z0000 Encounter for general adult medical examination without abnormal findings: Secondary | ICD-10-CM

## 2020-11-21 NOTE — Progress Notes (Signed)
MEDICARE ANNUAL WELLNESS VISIT  11/21/2020  Telephone Visit Disclaimer This Medicare AWV was conducted by telephone due to national recommendations for restrictions regarding the COVID-19 Pandemic (e.g. social distancing).  I verified, using two identifiers, that I am speaking with Catherine Jensen or their authorized healthcare agent. I discussed the limitations, risks, security, and privacy concerns of performing an evaluation and management service by telephone and the potential availability of an in-person appointment in the future. The patient expressed understanding and agreed to proceed.  Location of Patient: Home Location of Provider (nurse):  [facility]  Subjective:    [name] is a 69 y.o. female patient of Hawks, Theador Hawthorne, FNP who had a Medicare Annual Wellness Visit today via telephone. Madysin is Retired and lives with their spouse. she has one biological child and two step children. she reports that she is socially active and does interact with friends/family regularly. she is moderately physically active and enjoys playing golf.  Patient Care Team: Sharion Balloon, FNP as PCP - General (Family Medicine) Selinda Orion, MD (Inactive) as Referring Physician (Obstetrics and Gynecology)  Advanced Directives 11/21/2020 11/18/2019 07/09/2018 01/09/2017  Does Patient Have a Medical Advance Directive? No No No No  Would patient like information on creating a medical advance directive? No - Patient declined No - Patient declined No - Patient declined No - Patient declined    Hospital Utilization Over the Past 12 Months: # of hospitalizations or ER visits: 0 # of surgeries: 0  Review of Systems    Patient reports that her overall health is unchanged compared to last year.  Patient Reported Readings (BP, Pulse, CBG, Weight, etc) none  Pain Assessment Pain : No/denies pain     Current Medications & Allergies (verified) Allergies as of  11/21/2020      Reactions   Codeine    Musk Oil Fragrance    hives   Walnuts [nuts]       Medication List       Accurate as of November 21, 2020  8:50 AM. If you have any questions, ask your nurse or doctor.        aspirin 325 MG tablet Take 325 mg by mouth daily.   BIOTIN PO Take by mouth daily.   Lysine 1000 MG Tabs Take 1 tablet by mouth daily.   multivitamin with minerals tablet Take 1 tablet by mouth daily.   Vitamin D-3 125 MCG (5000 UT) Tabs Take 1 capsule by mouth daily.       History (reviewed): Past Medical History:  Diagnosis Date  . COPD (chronic obstructive pulmonary disease) ([place])   . Endometriosis   . FH: lung cancer   . H/O blood clots    in legs  . Multiple allergies    pt states she took allergy shots x 20 yrs  . Tobacco abuse    Past Surgical History:  Procedure Laterality Date  . ABLATION ON ENDOMETRIOSIS    . SKIN LESION EXCISION    . TOTAL ABDOMINAL HYSTERECTOMY  4/91  . TUBAL LIGATION    . TUMOR REMOVAL     Family History  Problem Relation Age of Onset  . Heart attack Brother   . Diabetes Brother   . Kidney failure Brother        35  . Lung cancer Sister        w/ mets to brain  . Asthma Sister   . Crohn's disease Sister   .  Cancer Sister        lung cancer  . Heart disease Mother   . Diabetes Mother    Social History   Socioeconomic History  . Marital status: Married    Spouse name: [name]  . Number of children: 1  . Years of education: 61  . Highest education level: 12th grade  Occupational History  . Occupation: Armed forces training and education officer: [employer]    Comment: exposed to [employer] @ work  Tobacco Use  . Smoking status: Former Smoker    Packs/day: 0.50    Years: 21.00    Pack years: 10.50    Types: Cigarettes    Quit date: 11/26/2008    Years since quitting: 11.9  . Smokeless tobacco: Never Used  Vaping Use  . Vaping Use: Never used  Substance and Sexual Activity  . Alcohol use: No  . Drug use: No  .  Sexual activity: Not Currently    Partners: Male    Birth control/protection: Surgical    Comment: hysterectomy  Other Topics Concern  . Not on file  Social History Narrative  . Not on file   Social Determinants of Health   Financial Resource Strain: Not on file  Food Insecurity: Not on file  Transportation Needs: Not on file  Physical Activity: Not on file  Stress: Not on file  Social Connections: Not on file    Activities of Daily Living In your present state of health, do you have any difficulty performing the following activities: 11/21/2020  Hearing? N  Vision? N  Difficulty concentrating or making decisions? N  Walking or climbing stairs? N  Dressing or bathing? N  Doing errands, shopping? N  Preparing Food and eating ? N  Using the Toilet? N  In the past six months, have you accidently leaked urine? N  Do you have problems with loss of bowel control? N  Managing your Medications? N  Managing your Finances? N  Housekeeping or managing your Housekeeping? N  Some recent data might be hidden    Patient Education/ Literacy How often do you need to have someone help you when you read instructions, pamphlets, or other written materials from your doctor or pharmacy?: 1 - Never What is the last grade level you completed in school?: 12th grade  Exercise Current Exercise Habits: Home exercise routine, Type of exercise: Other - see comments (golf), Time (Minutes): > 60, Frequency (Times/Week): 3, Weekly Exercise (Minutes/Week): 0, Intensity: Mild, Exercise limited by: None identified  Diet Patient reports consuming 3 meals a day and 0 snack(s) a day Patient reports that her primary diet is: Regular Patient reports that she does have regular access to food.   Depression Screen PHQ 2/9 Scores 11/21/2020 11/18/2019 08/06/2019 01/14/2019 10/07/2018 07/24/2018 07/09/2018  PHQ - 2 Score 0 0 0 0 0 0 0     Fall Risk Fall Risk  11/21/2020 11/18/2019 08/06/2019 01/14/2019 10/07/2018   Falls in the past year? 0 0 0 0 0     Objective:  Catherine Jensen seemed alert and oriented and she participated appropriately during our telephone visit.  Blood Pressure Weight BMI  BP Readings from Last 3 Encounters:  08/29/20 (!) 144/80  08/06/19 136/81  05/06/19 120/80   Wt Readings from Last 3 Encounters:  08/29/20 88 lb 9.6 oz (40.2 kg)  08/06/19 89 lb 6.4 oz (40.6 kg)  05/06/19 90 lb (40.8 kg)   BMI Readings from Last 1 Encounters:  08/29/20 17.90 kg/m    *  Unable to obtain current vital signs, weight, and BMI due to telephone visit type  Hearing/Vision  . Alnita did not seem to have difficulty with hearing/understanding during the telephone conversation . Reports that she has not had a formal eye exam by an eye care professional within the past year . Reports that she has not had a formal hearing evaluation within the past year *Unable to fully assess hearing and vision during telephone visit type  Cognitive Function: 6CIT Screen 11/21/2020 11/18/2019  What Year? 0 points 0 points  What month? 0 points 0 points  What time? 0 points 0 points  Count back from 20 0 points 0 points  Months in reverse 2 points 2 points  Repeat phrase 0 points 0 points  Total Score 2 2   (Normal:0-7, Significant for Dysfunction: >8)  Normal Cognitive Function Screening: Yes   Immunization & Health Maintenance Record Immunization History  Administered Date(s) Administered  . Fluad Quad(high Dose 65+) 09/01/2020  . Influenza, High Dose Seasonal PF 09/29/2018  . Influenza,inj,Quad PF,6+ Mos 09/14/2013, 01/09/2017, 09/04/2017, 10/14/2019  . Moderna Sars-Covid-2 Vaccination 12/23/2019, 01/20/2020, 11/07/2020  . Pneumococcal Conjugate-13 01/09/2017  . Pneumococcal Polysaccharide-23 05/23/2011, 07/24/2018  . Pneumococcal-Unspecified 04/08/2013  . Tdap 04/18/2016    Health Maintenance  Topic Date Due  . Fecal DNA (Cologuard)  08/19/2021  . MAMMOGRAM  06/27/2022  . TETANUS/TDAP   04/18/2026  . INFLUENZA VACCINE  Completed  . DEXA SCAN  Completed  . COVID-19 Vaccine  Completed  . Hepatitis C Screening  Completed  . PNA vac Low Risk Adult  Completed       Assessment  This is a routine wellness examination for Catherine Jensen.  Health Maintenance: Due or Overdue There are no preventive care reminders to display for this patient.  Catherine Jensen does not need a referral for Community Assistance: Care Management:   no Social Work:    no Prescription Assistance:  no Nutrition/Diabetes Education:  no   Plan:  Personalized Goals Goals Addressed            This Visit's Progress   . DIET - INCREASE WATER INTAKE   On track    Try to drink 6-8 glasses of water daily    . Exercise 3x per week (30 min per time)   On track   . Have 3 meals a day   On track     Personalized Health Maintenance & Screening Recommendations  Bone densitometry screening  Lung Cancer Screening Recommended: no (Low Dose CT Chest recommended if Age 80-80 years, 30 pack-year currently smoking OR have quit w/in past 15 years) Hepatitis C Screening recommended: no HIV Screening recommended: no  Advanced Directives: Written information was not prepared per patient's request.  Referrals & Orders No orders of the defined types were placed in this encounter.   Follow-up Plan . Follow-up with Sharion Balloon, FNP as planned . Schedule 12/22/20    I have personally reviewed and noted the following in the patient's chart:   . Medical and social history . Use of alcohol, tobacco or illicit drugs  . Current medications and supplements . Functional ability and status . Nutritional status . Physical activity . Advanced directives . List of other physicians . Hospitalizations, surgeries, and ER visits in previous 12 months . Vitals . Screenings to include cognitive, depression, and falls . Referrals and appointments  In addition, I have reviewed and discussed with Catherine Jensen certain preventive protocols, quality metrics, and  best practice recommendations. A written personalized care plan for preventive services as well as general preventive health recommendations is available and can be mailed to the patient at her request.      Maud Deed [facility]  01/41/0301

## 2020-11-21 NOTE — Patient Instructions (Signed)
  [place] Maintenance Summary and Written Plan of Care  Ms. Catherine Jensen ,  Thank you for allowing me to perform your Medicare Annual Wellness Visit and for your ongoing commitment to your health.   Health Maintenance & Immunization History Health Maintenance  Topic Date Due  . Fecal DNA (Cologuard)  08/19/2021  . MAMMOGRAM  06/27/2022  . TETANUS/TDAP  04/18/2026  . INFLUENZA VACCINE  Completed  . DEXA SCAN  Completed  . COVID-19 Vaccine  Completed  . Hepatitis C Screening  Completed  . PNA vac Low Risk Adult  Completed   Immunization History  Administered Date(s) Administered  . Fluad Quad(high Dose 65+) 09/01/2020  . Influenza, High Dose Seasonal PF 09/29/2018  . Influenza,inj,Quad PF,6+ Mos 09/14/2013, 01/09/2017, 09/04/2017, 10/14/2019  . Moderna Sars-Covid-2 Vaccination 12/23/2019, 01/20/2020, 11/07/2020  . Pneumococcal Conjugate-13 01/09/2017  . Pneumococcal Polysaccharide-23 05/23/2011, 07/24/2018  . Pneumococcal-Unspecified 04/08/2013  . Tdap 04/18/2016    These are the patient goals that we discussed: Goals Addressed            This Visit's Progress   . DIET - INCREASE WATER INTAKE   On track    Try to drink 6-8 glasses of water daily    . Exercise 3x per week (30 min per time)   On track   . Have 3 meals a day   On track       This is a list of Health Maintenance Items that are overdue or due now: There are no preventive care reminders to display for this patient.   Orders/Referrals Placed Today: No orders of the defined types were placed in this encounter.  (Contact our referral department at [phone] if you have not spoken with someone about your referral appointment within the next 5 days)    Follow-up Plan  Scheduled with [name], FNP 12/22/2020 at 10:25am.

## 2020-12-01 DIAGNOSIS — H11043 Peripheral pterygium, stationary, bilateral: Secondary | ICD-10-CM | POA: Diagnosis not present

## 2020-12-01 DIAGNOSIS — H43392 Other vitreous opacities, left eye: Secondary | ICD-10-CM | POA: Diagnosis not present

## 2020-12-01 DIAGNOSIS — H04123 Dry eye syndrome of bilateral lacrimal glands: Secondary | ICD-10-CM | POA: Diagnosis not present

## 2020-12-01 DIAGNOSIS — H25813 Combined forms of age-related cataract, bilateral: Secondary | ICD-10-CM | POA: Diagnosis not present

## 2020-12-08 ENCOUNTER — Ambulatory Visit (INDEPENDENT_AMBULATORY_CARE_PROVIDER_SITE_OTHER): Payer: Medicare Other | Admitting: Family Medicine

## 2020-12-08 ENCOUNTER — Encounter: Payer: Self-pay | Admitting: Family Medicine

## 2020-12-08 DIAGNOSIS — J0110 Acute frontal sinusitis, unspecified: Secondary | ICD-10-CM | POA: Diagnosis not present

## 2020-12-08 MED ORDER — AZITHROMYCIN 250 MG PO TABS: ORAL_TABLET | ORAL | 0 refills | Status: DC

## 2020-12-08 NOTE — Progress Notes (Signed)
Virtual Visit via telephone Note  I connected with Catherine Jensen on 12/08/20 at 1724 by telephone and verified that I am speaking with the correct person using two identifiers. Catherine Jensen is currently located at home and patient are currently with her during visit. The provider, Fransisca Kaufmann Aron Inge, MD is located in their office at time of visit.  Call ended at 1732  I discussed the limitations, risks, security and privacy concerns of performing an evaluation and management service by telephone and the availability of in person appointments. I also discussed with the patient that there may be a patient responsible charge related to this service. The patient expressed understanding and agreed to proceed.   History and Present Illness: Patient has cough and fever and congestion and phlegm.  She gets this sometimes.  She has low grade fevers. She usually takes azithromycin and it helps. She has this for 5 days.  She feels weak.  She has fatigue.  She is drinking water and making good urine. She had flu vaccine and covid vaccine and booster.   No diagnosis found.  Outpatient Encounter Medications as of 12/08/2020  Medication Sig  . aspirin 325 MG tablet Take 325 mg by mouth daily.  Marland Kitchen BIOTIN PO Take by mouth daily.  . Cholecalciferol (VITAMIN D-3) 125 MCG (5000 UT) TABS Take 1 capsule by mouth daily.  Marland Kitchen Lysine 1000 MG TABS Take 1 tablet by mouth daily.  . Multiple Vitamins-Minerals (MULTIVITAMIN WITH MINERALS) tablet Take 1 tablet by mouth daily.   No facility-administered encounter medications on file as of 12/08/2020.    Review of Systems  Constitutional: Positive for fever. Negative for chills.  HENT: Positive for congestion, postnasal drip, rhinorrhea, sinus pressure, sneezing and sore throat. Negative for ear discharge and ear pain.   Eyes: Negative for pain, redness and visual disturbance.  Respiratory: Positive for cough. Negative for chest tightness, shortness of breath  and wheezing.   Cardiovascular: Negative for chest pain and leg swelling.  Genitourinary: Negative for difficulty urinating and dysuria.  Musculoskeletal: Negative for back pain and gait problem.  Skin: Negative for rash.  Neurological: Negative for light-headedness and headaches.  Psychiatric/Behavioral: Negative for agitation and behavioral problems.  All other systems reviewed and are negative.   Observations/Objective: Patient sounds comfortable in no acute distress  Assessment and Plan: Problem List Items Addressed This Visit   None   Visit Diagnoses    Acute non-recurrent frontal sinusitis    -  Primary   Relevant Medications   azithromycin (ZITHROMAX) 250 MG tablet   Other Relevant Orders   Novel Coronavirus, NAA ([facility])      Continue to stay hydrated and will do the azithromycin, patient will come in and get COVID tested tomorrow and continue to quarantine Follow up plan: No follow-ups on file.     I discussed the assessment and treatment plan with the patient. The patient was provided an opportunity to ask questions and all were answered. The patient agreed with the plan and demonstrated an understanding of the instructions.   The patient was advised to call back or seek an in-person evaluation if the symptoms worsen or if the condition fails to improve as anticipated.  The above assessment and management plan was discussed with the patient. The patient verbalized understanding of and has agreed to the management plan. Patient is aware to call the clinic if symptoms persist or worsen. Patient is aware when to return to the clinic for a follow-up visit.  Patient educated on when it is appropriate to go to the emergency department.    I provided 8 minutes of non-face-to-face time during this encounter.    Worthy Rancher, MD

## 2020-12-09 ENCOUNTER — Other Ambulatory Visit: Payer: Medicare Other

## 2020-12-09 ENCOUNTER — Other Ambulatory Visit: Payer: Self-pay

## 2020-12-09 DIAGNOSIS — J0110 Acute frontal sinusitis, unspecified: Secondary | ICD-10-CM

## 2020-12-11 LAB — NOVEL CORONAVIRUS, NAA: SARS-CoV-2, NAA: NOT DETECTED

## 2020-12-11 LAB — SARS-COV-2, NAA 2 DAY TAT

## 2020-12-22 ENCOUNTER — Other Ambulatory Visit: Payer: Self-pay

## 2020-12-22 ENCOUNTER — Encounter: Payer: Self-pay | Admitting: Family

## 2020-12-22 ENCOUNTER — Ambulatory Visit (INDEPENDENT_AMBULATORY_CARE_PROVIDER_SITE_OTHER): Payer: Medicare Other | Admitting: Family

## 2020-12-22 ENCOUNTER — Ambulatory Visit (INDEPENDENT_AMBULATORY_CARE_PROVIDER_SITE_OTHER): Payer: Medicare Other

## 2020-12-22 VITALS — BP 129/71 | HR 73 | Temp 97.5°F | Ht 59.0 in | Wt 93.2 lb

## 2020-12-22 DIAGNOSIS — Z0000 Encounter for general adult medical examination without abnormal findings: Secondary | ICD-10-CM | POA: Diagnosis not present

## 2020-12-22 DIAGNOSIS — Z136 Encounter for screening for cardiovascular disorders: Secondary | ICD-10-CM | POA: Diagnosis not present

## 2020-12-22 DIAGNOSIS — Z780 Asymptomatic menopausal state: Secondary | ICD-10-CM | POA: Diagnosis not present

## 2020-12-22 DIAGNOSIS — J441 Chronic obstructive pulmonary disease with (acute) exacerbation: Secondary | ICD-10-CM | POA: Diagnosis not present

## 2020-12-22 DIAGNOSIS — Z0001 Encounter for general adult medical examination with abnormal findings: Secondary | ICD-10-CM

## 2020-12-22 NOTE — Addendum Note (Signed)
Addended by: Brynda Peon F on: 12/22/2020 11:17 AM   Modules accepted: Orders

## 2020-12-22 NOTE — Progress Notes (Signed)
Subjective:    Patient ID: Catherine Jensen, female    DOB: 16-Oct-1951, 70 y.o.   MRN: [id number]  Chief Complaint  Patient presents with  . Annual Exam    HPI Pt presents to the office today for CPE. Pt currently not taking any prescription medications, but does take several vitamins. Pt denies any headache, palpitations, SOB, or edema at this time. She continues to play golf 3-4 times a week during the summer.    She has COPD, but does not take any inhalers. She quit smoking about 8-9 years ago.  Her last lab work showed hypercalcemia.    Review of Systems  All other systems reviewed and are negative.      Objective:   Physical Exam Vitals reviewed.  Constitutional:      General: She is not in acute distress.    Appearance: She is well-developed and well-nourished.  HENT:     Head: Normocephalic and atraumatic.     Right Ear: Tympanic membrane normal.     Left Ear: Tympanic membrane normal.     Mouth/Throat:     Mouth: Oropharynx is clear and moist.  Eyes:     Pupils: Pupils are equal, round, and reactive to light.  Neck:     Thyroid: No thyromegaly.  Cardiovascular:     Rate and Rhythm: Normal rate and regular rhythm.     Pulses: Intact distal pulses.     Heart sounds: Normal heart sounds. No murmur heard.   Pulmonary:     Effort: Pulmonary effort is normal. No respiratory distress.     Breath sounds: Wheezing present.  Abdominal:     General: Bowel sounds are normal. There is no distension.     Palpations: Abdomen is soft.     Tenderness: There is no abdominal tenderness.  Musculoskeletal:        General: No tenderness or edema. Normal range of motion.     Cervical back: Normal range of motion and neck supple.  Skin:    General: Skin is warm and dry.  Neurological:     Mental Status: She is alert and oriented to person, place, and time.     Cranial Nerves: No cranial nerve deficit.     Deep Tendon Reflexes: Reflexes are normal and symmetric.   Psychiatric:        Mood and Affect: Mood and affect normal.        Behavior: Behavior normal.        Thought Content: Thought content normal.        Judgment: Judgment normal.          BP 129/71   Pulse 73   Temp (!) 97.5 F (36.4 C) (Temporal)   Ht _0  (1.499 m)   Wt 93 lb 3.2 oz (42.3 kg)   LMP 02/24/1990 Comment: [facility] does pap,[name] CNM  BMI 18.82 kg/m   Assessment & Plan:  [name] comes in today with chief complaint of Annual Exam   Diagnosis and orders addressed:  1. Annual physical exam - CMP14+EGFR - CBC with Differential/Platelet - Lipid panel - TSH - VITAMIN D 25 Hydroxy (Vit-D Deficiency, Fractures) - PTH, Intact (ICMA) and Ionized Calcium - DG [facility] DEXA - CT CHEST LUNG CANCER SCREENING LOW DOSE WO CONTRAST; Future  2. Chronic obstructive pulmonary disease with acute exacerbation (HCC) - CMP14+EGFR - CBC with Differential/Platelet  3. Hypercalcemia -Labs pending  CT scan to rule out cancer Dexa scan today -  CMP14+EGFR - CBC with Differential/Platelet - VITAMIN D 25 Hydroxy (Vit-D Deficiency, Fractures) - PTH, Intact (ICMA) and Ionized Calcium - DG [facility] DEXA - CT CHEST LUNG CANCER SCREENING LOW DOSE WO CONTRAST; Future  4. Post-menopausal - CMP14+EGFR   Labs pending Health Maintenance reviewed Diet and exercise encouraged  Follow up plan: 1 month   [name], FNP

## 2020-12-22 NOTE — Patient Instructions (Signed)
Hypercalcemia Hypercalcemia is when the level of calcium in a person's blood is above normal. The body needs calcium to make bones and keep them strong. Calcium also helps the muscles, nerves, brain, and heart work the way they should. Most of the calcium in the body is in the bones. There is also some calcium in the blood. Hypercalcemia can happen when calcium comes out of the bones, or when the kidneys are not able to remove calcium from the blood. Hypercalcemia can be mild or severe. What are the causes? There are many possible causes of hypercalcemia. Common causes of this condition include:  Hyperparathyroidism. This is a condition in which the body produces too much parathyroid hormone. There are four parathyroid glands in your neck. These glands produce a chemical messenger (hormone) that helps the body absorb calcium from foods and helps your bones release calcium.  Certain kinds of cancer. Less common causes of hypercalcemia include:  Getting too much calcium or vitamin D from your diet.  Kidney failure.  Hyperthyroidism.  Severe dehydration.  Being on bed rest or being inactive for a long time.  Certain medicines.  Infections. What increases the risk? You are more likely to develop this condition if you:  Are female.  Are 75 years of age or older.  Have a family history of hypercalcemia. What are the signs or symptoms? Mild hypercalcemia that starts slowly may not cause symptoms. Severe, sudden hypercalcemia is more likely to cause symptoms, such as:  Being more thirsty than usual.  Needing to urinate more often than usual.  Abdominal pain.  Nausea and vomiting.  Constipation.  Muscle pain, twitching, or weakness.  Feeling very tired. How is this diagnosed? Hypercalcemia is usually diagnosed with a blood test. You may also have tests to help determine what is causing this condition, such as imaging tests and more blood tests.   How is this  treated? Treatment for hypercalcemia depends on the cause. Treatment may include:  Receiving fluids through an IV.  Medicines that: ? Keep calcium levels steady after receiving fluids (loop diuretics). ? Keep calcium in your bones (bisphosphonates). ? Lower the calcium level in your blood.  Surgery to remove overactive parathyroid glands.  A procedure that filters your blood to correct calcium levels (hemodialysis). Follow these instructions at home:  Take over-the-counter and prescription medicines only as told by your health care provider.  Follow instructions from your health care provider about eating or drinking restrictions.  Drink enough fluid to keep your urine pale yellow.  Stay active. Weight-bearing exercise helps to keep calcium in your bones. Follow instructions from your health care provider about what type and level of exercise is safe for you.  Keep all follow-up visits as told by your health care provider. This is important.   Contact a health care provider if you have:  A fever.  A heartbeat that is irregular or very fast.  Changes in mood, memory, or personality. Get help right away if you:  Have severe abdominal pain.  Have chest pain.  Have trouble breathing.  Become very confused and sleepy.  Lose consciousness. Summary  Hypercalcemia is when the level of calcium in a person's blood is above normal. The body needs calcium to make bones and keep them strong. Calcium also helps the muscles, nerves, brain, and heart work the way they should.  There are many possible causes of hypercalcemia, and treatment depends on the cause.  Take over-the-counter and prescription medicines only as told by your  health care provider.  Follow instructions from your health care provider about eating or drinking restrictions. This information is not intended to replace advice given to you by your health care provider. Make sure you discuss any questions you have with  your health care provider. Document Revised: 12/09/2018 Document Reviewed: 08/18/2018 Elsevier Patient Education  2021 [employer].

## 2020-12-23 ENCOUNTER — Telehealth: Payer: Self-pay | Admitting: Family

## 2020-12-23 DIAGNOSIS — M8589 Other specified disorders of bone density and structure, multiple sites: Secondary | ICD-10-CM | POA: Diagnosis not present

## 2020-12-23 DIAGNOSIS — Z780 Asymptomatic menopausal state: Secondary | ICD-10-CM | POA: Diagnosis not present

## 2020-12-23 LAB — CBC WITH DIFFERENTIAL/PLATELET
Basophils Absolute: 0.1 10*3/uL (ref 0.0–0.2)
Basos: 1 %
EOS (ABSOLUTE): 0.2 10*3/uL (ref 0.0–0.4)
Eos: 4 %
Hematocrit: 39.6 % (ref 34.0–46.6)
Hemoglobin: 13.9 g/dL (ref 11.1–15.9)
Immature Grans (Abs): 0 10*3/uL (ref 0.0–0.1)
Immature Granulocytes: 0 %
Lymphocytes Absolute: 1.3 10*3/uL (ref 0.7–3.1)
Lymphs: 30 %
MCH: 32 pg (ref 26.6–33.0)
MCHC: 35.1 g/dL (ref 31.5–35.7)
MCV: 91 fL (ref 79–97)
Monocytes Absolute: 0.4 10*3/uL (ref 0.1–0.9)
Monocytes: 9 %
Neutrophils Absolute: 2.3 10*3/uL (ref 1.4–7.0)
Neutrophils: 56 %
Platelets: 247 10*3/uL (ref 150–450)
RBC: 4.35 x10E6/uL (ref 3.77–5.28)
RDW: 10.8 % — ABNORMAL LOW (ref 11.7–15.4)
WBC: 4.2 10*3/uL (ref 3.4–10.8)

## 2020-12-23 LAB — CMP14+EGFR
ALT: 16 IU/L (ref 0–32)
AST: 22 IU/L (ref 0–40)
Albumin/Globulin Ratio: 2.1 (ref 1.2–2.2)
Albumin: 4.6 g/dL (ref 3.8–4.8)
Alkaline Phosphatase: 87 IU/L (ref 44–121)
BUN/Creatinine Ratio: 13 (ref 12–28)
BUN: 9 mg/dL (ref 8–27)
Bilirubin Total: 0.3 mg/dL (ref 0.0–1.2)
CO2: 26 mmol/L (ref 20–29)
Calcium: 10.6 mg/dL — ABNORMAL HIGH (ref 8.7–10.3)
Chloride: 101 mmol/L (ref 96–106)
Creatinine, Ser: 0.72 mg/dL (ref 0.57–1.00)
GFR calc Af Amer: 100 mL/min/{1.73_m2} (ref >59)
GFR calc non Af Amer: 86 mL/min/{1.73_m2} (ref >59)
Globulin, Total: 2.2 g/dL (ref 1.5–4.5)
Glucose: 86 mg/dL (ref 65–99)
Potassium: 4.8 mmol/L (ref 3.5–5.2)
Sodium: 143 mmol/L (ref 134–144)
Total Protein: 6.8 g/dL (ref 6.0–8.5)

## 2020-12-23 LAB — LIPID PANEL
Chol/HDL Ratio: 2.5 ratio (ref 0.0–4.4)
Cholesterol, Total: 208 mg/dL — ABNORMAL HIGH (ref 100–199)
HDL: 82 mg/dL (ref >39)
LDL Chol Calc (NIH): 108 mg/dL — ABNORMAL HIGH (ref 0–99)
Triglycerides: 102 mg/dL (ref 0–149)
VLDL Cholesterol Cal: 18 mg/dL (ref 5–40)

## 2020-12-23 LAB — PTH, INTACT AND CALCIUM
Calcium: 10.9 mg/dL — ABNORMAL HIGH (ref 8.7–10.3)
PTH: 18 pg/mL (ref 15–65)

## 2020-12-23 LAB — VITAMIN D 25 HYDROXY (VIT D DEFICIENCY, FRACTURES): Vit D, 25-Hydroxy: 101 ng/mL — ABNORMAL HIGH (ref 30.0–100.0)

## 2020-12-23 LAB — TSH: TSH: 1 u[IU]/mL (ref 0.450–4.500)

## 2020-12-26 ENCOUNTER — Other Ambulatory Visit: Payer: Self-pay | Admitting: Family

## 2020-12-26 DIAGNOSIS — M810 Age-related osteoporosis without current pathological fracture: Secondary | ICD-10-CM | POA: Insufficient documentation

## 2020-12-26 DIAGNOSIS — M8580 Other specified disorders of bone density and structure, unspecified site: Secondary | ICD-10-CM | POA: Insufficient documentation

## 2021-01-02 ENCOUNTER — Telehealth: Payer: Self-pay

## 2021-01-02 ENCOUNTER — Other Ambulatory Visit: Payer: Self-pay | Admitting: Family

## 2021-01-02 ENCOUNTER — Other Ambulatory Visit: Payer: Self-pay

## 2021-01-02 ENCOUNTER — Other Ambulatory Visit: Payer: Medicare Other

## 2021-01-02 ENCOUNTER — Ambulatory Visit (INDEPENDENT_AMBULATORY_CARE_PROVIDER_SITE_OTHER): Payer: Medicare Other

## 2021-01-02 DIAGNOSIS — F17200 Nicotine dependence, unspecified, uncomplicated: Secondary | ICD-10-CM

## 2021-01-02 DIAGNOSIS — J984 Other disorders of lung: Secondary | ICD-10-CM | POA: Diagnosis not present

## 2021-01-02 NOTE — Telephone Encounter (Signed)
Spoke to patient in regards to labs -  Patient is requesting update on CT scan order states that she has not heard anything about setting up appt yet

## 2021-01-11 ENCOUNTER — Encounter (HOSPITAL_COMMUNITY): Payer: Self-pay

## 2021-01-11 NOTE — Progress Notes (Signed)
Received referral for initial lung cancer screening scan. Contacted patient and obtained smoking history (started age 70 smoking 1/2 PPD, former smoker having quit 8 years ago, 20.5 pack year) as well as answering questions related to the screening process. Patient denies signs/symptoms of lung cancer such as weight loss or hemoptysis. Patient denies comorbidity that would prevent curative treatment if lung cancer were to be found. Patient does not meet criteria for LCS at this time. [name], NP made aware.

## 2021-01-26 ENCOUNTER — Other Ambulatory Visit: Payer: Self-pay

## 2021-01-26 ENCOUNTER — Ambulatory Visit (INDEPENDENT_AMBULATORY_CARE_PROVIDER_SITE_OTHER): Payer: Medicare Other | Admitting: Family

## 2021-01-26 ENCOUNTER — Encounter: Payer: Self-pay | Admitting: Family

## 2021-01-26 DIAGNOSIS — F17200 Nicotine dependence, unspecified, uncomplicated: Secondary | ICD-10-CM

## 2021-01-26 DIAGNOSIS — J449 Chronic obstructive pulmonary disease, unspecified: Secondary | ICD-10-CM

## 2021-01-26 NOTE — Patient Instructions (Signed)
Hypercalcemia Hypercalcemia is when the level of calcium in a person's blood is above normal. The body needs calcium to make bones and keep them strong. Calcium also helps the muscles, nerves, brain, and heart work the way they should. Most of the calcium in the body is in the bones. There is also some calcium in the blood. Hypercalcemia can happen when calcium comes out of the bones, or when the kidneys are not able to remove calcium from the blood. Hypercalcemia can be mild or severe. What are the causes? There are many possible causes of hypercalcemia. Common causes of this condition include:  Hyperparathyroidism. This is a condition in which the body produces too much parathyroid hormone. There are four parathyroid glands in your neck. These glands produce a chemical messenger (hormone) that helps the body absorb calcium from foods and helps your bones release calcium.  Certain kinds of cancer. Less common causes of hypercalcemia include:  Getting too much calcium or vitamin D from your diet.  Kidney failure.  Hyperthyroidism.  Severe dehydration.  Being on bed rest or being inactive for a long time.  Certain medicines.  Infections. What increases the risk? You are more likely to develop this condition if you:  Are female.  Are 69 years of age or older.  Have a family history of hypercalcemia. What are the signs or symptoms? Mild hypercalcemia that starts slowly may not cause symptoms. Severe, sudden hypercalcemia is more likely to cause symptoms, such as:  Being more thirsty than usual.  Needing to urinate more often than usual.  Abdominal pain.  Nausea and vomiting.  Constipation.  Muscle pain, twitching, or weakness.  Feeling very tired. How is this diagnosed? Hypercalcemia is usually diagnosed with a blood test. You may also have tests to help determine what is causing this condition, such as imaging tests and more blood tests.   How is this  treated? Treatment for hypercalcemia depends on the cause. Treatment may include:  Receiving fluids through an IV.  Medicines that: ? Keep calcium levels steady after receiving fluids (loop diuretics). ? Keep calcium in your bones (bisphosphonates). ? Lower the calcium level in your blood.  Surgery to remove overactive parathyroid glands.  A procedure that filters your blood to correct calcium levels (hemodialysis). Follow these instructions at home:  Take over-the-counter and prescription medicines only as told by your health care provider.  Follow instructions from your health care provider about eating or drinking restrictions.  Drink enough fluid to keep your urine pale yellow.  Stay active. Weight-bearing exercise helps to keep calcium in your bones. Follow instructions from your health care provider about what type and level of exercise is safe for you.  Keep all follow-up visits as told by your health care provider. This is important.   Contact a health care provider if you have:  A fever.  A heartbeat that is irregular or very fast.  Changes in mood, memory, or personality. Get help right away if you:  Have severe abdominal pain.  Have chest pain.  Have trouble breathing.  Become very confused and sleepy.  Lose consciousness. Summary  Hypercalcemia is when the level of calcium in a person's blood is above normal. The body needs calcium to make bones and keep them strong. Calcium also helps the muscles, nerves, brain, and heart work the way they should.  There are many possible causes of hypercalcemia, and treatment depends on the cause.  Take over-the-counter and prescription medicines only as told by your  health care provider.  Follow instructions from your health care provider about eating or drinking restrictions. This information is not intended to replace advice given to you by your health care provider. Make sure you discuss any questions you have with  your health care provider. Document Revised: 12/09/2018 Document Reviewed: 08/18/2018 Elsevier Patient Education  2021 [employer].

## 2021-01-26 NOTE — Progress Notes (Signed)
   Subjective:    Patient ID: Catherine Jensen, female    DOB: March 09, 1951, 70 y.o.   MRN: [id number]  Chief Complaint  Patient presents with  . hypercalcemia    1 mth follow up     HPI PT presents to the office today to recheck hypercalcemia. She was seen on 12/22/20 and her Calcium was 10.6. She had a Dexa scan that showed Osteopenia. She has stopped her OTC calcium.   She reports she did not get her low dose CT scan because she was told she did not qualify for it.    Review of Systems  All other systems reviewed and are negative.      Objective:   Physical Exam Vitals reviewed.  Constitutional:      General: She is not in acute distress.    Appearance: She is well-developed and well-nourished.  HENT:     Head: Normocephalic and atraumatic.     Mouth/Throat:     Mouth: Oropharynx is clear and moist.  Eyes:     Pupils: Pupils are equal, round, and reactive to light.  Neck:     Thyroid: No thyromegaly.  Cardiovascular:     Rate and Rhythm: Normal rate and regular rhythm.     Pulses: Intact distal pulses.     Heart sounds: Normal heart sounds. No murmur heard.   Pulmonary:     Effort: Pulmonary effort is normal. No respiratory distress.     Breath sounds: Normal breath sounds. No wheezing.  Abdominal:     General: Bowel sounds are normal. There is no distension.     Palpations: Abdomen is soft.     Tenderness: There is no abdominal tenderness.  Musculoskeletal:        General: No tenderness or edema. Normal range of motion.     Cervical back: Normal range of motion and neck supple.  Skin:    General: Skin is warm and dry.  Neurological:     Mental Status: She is alert and oriented to person, place, and time.     Cranial Nerves: No cranial nerve deficit.     Deep Tendon Reflexes: Reflexes are normal and symmetric.  Psychiatric:        Mood and Affect: Mood and affect normal.        Behavior: Behavior normal.        Thought Content: Thought content normal.         Judgment: Judgment normal.       BP 132/60   Pulse (!) 59   Temp (!) 97 F (36.1 C) (Temporal)   Ht _0  (1.499 m)   Wt 93 lb 9.6 oz (42.5 kg)   LMP 02/24/1990 Comment: [facility] does pap,[name] CNM  BMI 18.90 kg/m      Assessment & Plan:  Catherine Jensen comes in today with chief complaint of hypercalcemia (1 mth follow up )   Diagnosis and orders addressed:  1. Hypercalcemia -Will repeat labs today CT pending If calcium is still elevated we will do referral to [place]; Future - BMP8+EGFR - Calcium, ionized  2. Smoker - CT CHEST WO CONTRAST; Future - BMP8+EGFR  3. Chronic obstructive pulmonary disease, unspecified COPD type ([place]) - CT CHEST WO CONTRAST; Future - BMP8+EGFR   Labs pending Health Maintenance reviewed Diet and exercise encouraged  Follow up plan: 6 months   [name], FNP

## 2021-01-27 ENCOUNTER — Other Ambulatory Visit: Payer: Self-pay | Admitting: Family

## 2021-01-27 ENCOUNTER — Telehealth: Payer: Self-pay | Admitting: Family

## 2021-01-27 LAB — BMP8+EGFR
BUN/Creatinine Ratio: 19 (ref 12–28)
BUN: 12 mg/dL (ref 8–27)
CO2: 23 mmol/L (ref 20–29)
Calcium: 11.2 mg/dL — ABNORMAL HIGH (ref 8.7–10.3)
Chloride: 104 mmol/L (ref 96–106)
Creatinine, Ser: 0.63 mg/dL (ref 0.57–1.00)
Glucose: 79 mg/dL (ref 65–99)
Potassium: 4.9 mmol/L (ref 3.5–5.2)
Sodium: 147 mmol/L — ABNORMAL HIGH (ref 134–144)
eGFR: 96 mL/min/{1.73_m2} (ref >59)

## 2021-01-27 LAB — CALCIUM, IONIZED: Calcium, Ion: 5.5 mg/dL (ref 4.5–5.6)

## 2021-02-10 ENCOUNTER — Inpatient Hospital Stay (HOSPITAL_COMMUNITY): Payer: Medicare Other

## 2021-02-10 ENCOUNTER — Encounter (HOSPITAL_COMMUNITY): Payer: Self-pay | Admitting: Hematology and Oncology

## 2021-02-10 ENCOUNTER — Other Ambulatory Visit: Payer: Self-pay

## 2021-02-10 ENCOUNTER — Inpatient Hospital Stay (HOSPITAL_COMMUNITY): Payer: Medicare Other | Attending: Hematology and Oncology | Admitting: Hematology and Oncology

## 2021-02-10 DIAGNOSIS — J449 Chronic obstructive pulmonary disease, unspecified: Secondary | ICD-10-CM | POA: Diagnosis not present

## 2021-02-10 DIAGNOSIS — M8580 Other specified disorders of bone density and structure, unspecified site: Secondary | ICD-10-CM | POA: Insufficient documentation

## 2021-02-10 DIAGNOSIS — Z86718 Personal history of other venous thrombosis and embolism: Secondary | ICD-10-CM | POA: Insufficient documentation

## 2021-02-10 DIAGNOSIS — Z801 Family history of malignant neoplasm of trachea, bronchus and lung: Secondary | ICD-10-CM | POA: Diagnosis not present

## 2021-02-10 DIAGNOSIS — Z87891 Personal history of nicotine dependence: Secondary | ICD-10-CM | POA: Insufficient documentation

## 2021-02-10 NOTE — Assessment & Plan Note (Signed)
This is a provoked DVT many years ago from birth control use.  There is no need for hypercoagulable work-up.  No additional anticoagulation recommendations.

## 2021-02-10 NOTE — Assessment & Plan Note (Signed)
This is a very pleasant 70 year old female patient who was referred to hematology and oncology for evaluation of persistent hypercalcemia.  It appears that patient has had longstanding mild hypercalcemia without any hypoalbuminemia. There is no evidence of hyperparathyroidism, PTH levels are normal. Vitamin D levels are highly suggestive of vitamin D related hypercalcemia versus granulomatous disease.  She already has a CT chest scheduled for end of this month. She has no concerning review of systems to evaluate for possible malignancy.  She is up-to-date with age-appropriate cancer screening.  With mild longstanding hypercalcemia, I do not recommend any further evaluation for malignancy besides a CT chest.  I did explain this to her that her presentation is not concerning for malignancy at this time however this does not mean that she will never develop a cancer in her future. Have ordered PTH related protein today.  Her ionized calcium is normal.  She will refrain from vitamin D supplementation for a while and she will follow-up with her primary care physician for monitoring of hypercalcemia.  We will be happy to see her again if hypercalcemia continues to progress or if there are any concerns for hypercalcemia of malignancy.

## 2021-02-10 NOTE — Assessment & Plan Note (Signed)
At this time given high vitamin D levels, have asked her to refrain from vitamin D and calcium supplementation. Her bone density needs to be monitored by DEXA scan every 2 years and one could consider bisphosphonates if needed.

## 2021-02-10 NOTE — Progress Notes (Signed)
[facility] CONSULT NOTE  Patient Care Team: Sharion Balloon, FNP as PCP - General (Family Medicine) Lomax, Marny Lowenstein, MD (Inactive) as Referring Physician (Obstetrics and Gynecology)  CHIEF COMPLAINTS/PURPOSE OF CONSULTATION:  Hypercalcemia  ASSESSMENT & PLAN:   Hypercalcemia This is a very pleasant 70 year old female patient who was referred to hematology and oncology for evaluation of persistent hypercalcemia.  It appears that patient has had longstanding mild hypercalcemia without any hypoalbuminemia. There is no evidence of hyperparathyroidism, PTH levels are normal. Vitamin D levels are highly suggestive of vitamin D related hypercalcemia versus granulomatous disease.  She already has a CT chest scheduled for end of this month. She has no concerning review of systems to evaluate for possible malignancy.  She is up-to-date with age-appropriate cancer screening.  With mild longstanding hypercalcemia, I do not recommend any further evaluation for malignancy besides a CT chest.  I did explain this to her that her presentation is not concerning for malignancy at this time however this does not mean that she will never develop a cancer in her future. Have ordered PTH related protein today.  Her ionized calcium is normal.  She will refrain from vitamin D supplementation for a while and she will follow-up with her primary care physician for monitoring of hypercalcemia.  We will be happy to see her again if hypercalcemia continues to progress or if there are any concerns for hypercalcemia of malignancy.  Personal history of DVT (deep vein thrombosis) This is a provoked DVT many years ago from birth control use.  There is no need for hypercoagulable work-up.  No additional anticoagulation recommendations.  Osteopenia At this time given high vitamin D levels, have asked her to refrain from vitamin D and calcium supplementation. Her bone density needs to be monitored by DEXA scan  every 2 years and one could consider bisphosphonates if needed.  Orders Placed This Encounter  Procedures  . PTH related peptide    Standing Status:   Future    Number of Occurrences:   1    Standing Expiration Date:   02/10/2022    HISTORY OF PRESENTING ILLNESS:   Catherine Jensen 70 y.o. female is here because of hypercalcemia.  This is a very pleasant 70 year old female patient with a past medical history significant for DVT about 40 years ago, COPD referred to hematology for evaluation of hypercalcemia.  [name] arrived to the appointment with her husband.  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  She absolutely denies any symptoms.  No B symptoms.  No changes in breathing, bowel habits or urinary habits.  No hematochezia or melena.  She is up-to-date with her age-appropriate cancer screening.  She has been taking vitamin D and calcium supplementation for about 40 years now.  She has been monitoring with her PCP regarding the hypercalcemia and she was sent to hematology for further evaluation.Marland Kitchen Rest of the pertinent 10 point review of systems reviewed and negative.  REVIEW OF SYSTEMS:   Constitutional: Denies fevers, chills or abnormal night sweats Eyes: Denies blurriness of vision, double vision or watery eyes Ears, nose, mouth, throat, and face: Denies mucositis or sore throat Respiratory: Denies cough, dyspnea or wheezes Cardiovascular: Denies palpitation, chest discomfort or lower extremity swelling Gastrointestinal:  Denies nausea, heartburn or change in bowel habits Skin: Denies abnormal skin rashes Lymphatics: Denies new lymphadenopathy or easy bruising Neurological:Denies  numbness, tingling or new weaknesses Behavioral/Psych: Mood is stable, no new changes  All other systems were reviewed with the patient and are negative.  MEDICAL HISTORY:  Past Medical History:   Diagnosis Date  . COPD (chronic obstructive pulmonary disease) ([place])   . Endometriosis   . FH: lung cancer   . H/O blood clots    in legs  . Multiple allergies    pt states she took allergy shots x 20 yrs  . Tobacco abuse     SURGICAL HISTORY: Past Surgical History:  Procedure Laterality Date  . ABLATION ON ENDOMETRIOSIS    . SKIN LESION EXCISION    . TOTAL ABDOMINAL HYSTERECTOMY  4/91  . TUBAL LIGATION    . TUMOR REMOVAL      SOCIAL HISTORY: Social History   Socioeconomic History  . Marital status: Married    Spouse name: [name]  . Number of children: 1  . Years of education: 67  . Highest education level: 12th grade  Occupational History  . Occupation: Armed forces training and education officer: [employer]    Comment: exposed to [employer] @ work  Tobacco Use  . Smoking status: Former Smoker    Packs/day: 0.50    Years: 21.00    Pack years: 10.50    Types: Cigarettes    Quit date: 11/26/2008    Years since quitting: 12.2  . Smokeless tobacco: Never Used  Vaping Use  . Vaping Use: Never used  Substance and Sexual Activity  . Alcohol use: No  . Drug use: No  . Sexual activity: Not Currently    Partners: Male    Birth control/protection: Surgical    Comment: hysterectomy  Other Topics Concern  . Not on file  Social History Narrative  . Not on file   Social Determinants of Health   Financial Resource Strain: Not on file  Food Insecurity: Not on file  Transportation Needs: Not on file  Physical Activity: Not on file  Stress: Not on file  Social Connections: Not on file  Intimate Partner Violence: Not on file    FAMILY HISTORY: Family History  Problem Relation Age of Onset  . Heart attack Brother   . Diabetes Brother   . Kidney failure Brother        69  . Lung cancer Sister        w/ mets to brain  . Asthma Sister   . Crohn's disease Sister   . Cancer Sister        lung cancer  . Heart disease Mother   . Diabetes Mother     ALLERGIES:  is allergic to  codeine, musk oil fragrance, and walnuts [nuts].  MEDICATIONS:  Current Outpatient Medications  Medication Sig Dispense Refill  . aspirin 325 MG tablet Take 325 mg by mouth daily.    Marland Kitchen BIOTIN PO Take by mouth daily.    . Cholecalciferol (VITAMIN D-3) 125 MCG (5000 UT) TABS Take 1 capsule by mouth daily.    Marland Kitchen Lysine 1000 MG TABS Take 1 tablet by mouth daily.    . Multiple Vitamins-Minerals (MULTIVITAMIN WITH MINERALS) tablet Take 1 tablet by mouth daily.     No current facility-administered medications for this visit.     PHYSICAL EXAMINATION:  ECOG PERFORMANCE STATUS: 0 - Asymptomatic  Vitals:   02/10/21 1138  BP: (!) 151/73  Pulse: 66  Resp: 17  Temp: 98 F (36.7 C)  SpO2: 97%   Filed Weights   02/10/21 1138  Weight: 93 lb 1 oz (42.2 kg)    GENERAL:alert, no distress and comfortable SKIN: skin color, texture, turgor are normal, no rashes or significant lesions EYES: normal, conjunctiva are pink and non-injected, sclera clear OROPHARYNX:no exudate, no erythema and lips, buccal mucosa, and tongue normal  NECK: supple, thyroid normal size, non-tender, without nodularity LYMPH:  no palpable lymphadenopathy in the cervical, axillary or inguinal LUNGS: clear to auscultation and percussion with normal breathing effort HEART: regular rate & rhythm and no murmurs and no lower extremity edema ABDOMEN:abdomen soft, non-tender and normal bowel sounds Musculoskeletal:no cyanosis of digits and no clubbing  PSYCH: alert & oriented x 3 with fluent speech NEURO: no focal motor/sensory deficits  LABORATORY DATA:  I have reviewed the data as listed Lab Results  Component Value Date   WBC 4.2 12/22/2020   HGB 13.9 12/22/2020   HCT 39.6 12/22/2020   MCV 91 12/22/2020   PLT 247 12/22/2020     Chemistry      Component Value Date/Time   NA 147 (H) 01/26/2021 1020   K 4.9 01/26/2021 1020   CL 104 01/26/2021 1020   CO2 23 01/26/2021 1020   BUN 12 01/26/2021 1020   CREATININE  0.63 01/26/2021 1020      Component Value Date/Time   CALCIUM 11.2 (H) 01/26/2021 1020   ALKPHOS 87 12/22/2020 1041   AST 22 12/22/2020 1041   ALT 16 12/22/2020 1041   BILITOT 0.3 12/22/2020 1041       RADIOGRAPHIC STUDIES: I have personally reviewed the radiological images as listed and agreed with the findings in the report. No results found.  All questions were answered. The patient knows to call the clinic with any problems, questions or concerns. I spent 45 minutes in the care of this patient including H and P, review of records, counseling and coordination of care.     Benay Pike, MD 02/10/2021 12:13 PM

## 2021-02-18 LAB — PTH-RELATED PEPTIDE: PTH-related peptide: <2 pmol/L

## 2021-02-20 ENCOUNTER — Ambulatory Visit (HOSPITAL_COMMUNITY)
Admission: RE | Admit: 2021-02-20 | Discharge: 2021-02-20 | Disposition: A | Discharge status: Home / Self Care | Payer: Medicare Other | Source: Ambulatory Visit | Attending: Family | Admitting: Family

## 2021-02-20 ENCOUNTER — Other Ambulatory Visit: Payer: Self-pay

## 2021-02-20 DIAGNOSIS — J449 Chronic obstructive pulmonary disease, unspecified: Secondary | ICD-10-CM | POA: Insufficient documentation

## 2021-02-20 DIAGNOSIS — C3490 Malignant neoplasm of unspecified part of unspecified bronchus or lung: Secondary | ICD-10-CM | POA: Diagnosis not present

## 2021-02-20 DIAGNOSIS — I2510 Atherosclerotic heart disease of native coronary artery without angina pectoris: Secondary | ICD-10-CM | POA: Diagnosis not present

## 2021-02-20 DIAGNOSIS — F17200 Nicotine dependence, unspecified, uncomplicated: Secondary | ICD-10-CM | POA: Insufficient documentation

## 2021-02-20 DIAGNOSIS — J432 Centrilobular emphysema: Secondary | ICD-10-CM | POA: Diagnosis not present

## 2021-05-30 ENCOUNTER — Encounter: Payer: Self-pay | Admitting: Nurse Practitioner

## 2021-05-30 ENCOUNTER — Ambulatory Visit (INDEPENDENT_AMBULATORY_CARE_PROVIDER_SITE_OTHER): Payer: Medicare Other | Admitting: Nurse Practitioner

## 2021-05-30 ENCOUNTER — Other Ambulatory Visit: Payer: Self-pay

## 2021-05-30 VITALS — BP 154/85 | HR 110 | Temp 98.3°F | Resp 20 | Ht 59.0 in | Wt 89.0 lb

## 2021-05-30 DIAGNOSIS — R238 Other skin changes: Secondary | ICD-10-CM | POA: Diagnosis not present

## 2021-05-30 MED ORDER — SULFAMETHOXAZOLE-TRIMETHOPRIM 800-160 MG PO TABS: 1.0000 | ORAL_TABLET | Freq: Two times a day (BID) | ORAL | 0 refills | Status: DC

## 2021-05-30 NOTE — Patient Instructions (Signed)
Blisters, Adult  A blister is a raised bubble of skin filled with liquid. Blisters often develop on skin that rubs or presses against another surface repeatedly (friction blister). Friction blisters can occur on any part of the body, but they usually form on the hands or the feet. Long-term pressure on that same area of skin can also cause the area of skin to become hardened. This hardened skin is called acallus. What are the causes? Besides friction, blisters can be caused by: An injury, such as a burn. An allergic reaction. An infection. Exposure to irritating chemicals. Friction blisters often occur in areas with a lot of heat and moisture. Friction blisters often result from: Sports. Repetitive activities. Using tools and doing other activities without wearing gloves. Shoes that are too tight or too loose. What are the signs or symptoms? A blister is often round and looks like a bump. It may hurt or feel itchy. Before a blister forms, your skin may: Turn red. Feel warm. Itch. Be painful to the touch. How is this diagnosed? A blister is diagnosed with a physical exam. How is this treated? Treatment usually involves protecting the area where the blister has formed until your skin has healed. Treatments may include: Using a bandage (dressing) to cover your blister. Putting extra padding around and over your blister so that the blister does not rub on anything. Applying antibiotic ointment. Most blisters break open, dry up, and go away on their own within 1-2 weeks. Blisters that are very painful may be drained before they break open on their own. If your blister is large or painful, your health care provider can drainit. Follow these instructions at home: Medicines Take or apply over-the-counter and prescription medicines only as told by your health care provider. If you were prescribed an antibiotic medicine, take or apply it as told by your health care provider. Do not stop using  the antibiotic even if you start to feel better. Skin care Do not pop your blister. This can cause infection. Keep your blister clean and dry. This helps to prevent infection. Before you swim or use a hot tub, cover your blister with a waterproof dressing. Protect the area where your blister has formed as told by your health care provider. Follow instructions from your health care provider about how to take care of your blister. Make sure you: Wash your hands with soap and water for at least 20 seconds before and after you change your dressing. If soap and water are not available, use hand sanitizer. Change your dressing as told by your health care provider. Infection signs Check your blister every day for signs of infection. Check for: More redness, swelling, or pain. More fluid or blood. Warmth. Pus or a bad smell. General instructions If you have a blister on a foot or toe, wear different shoes until your blister heals. Avoid the activity that caused the blister until your blister heals. Keep all follow-up visits as told by your health care provider. This is important. How is this prevented? Taking these steps can help to prevent blisters that are caused by friction. Make sure you: Wear comfortable shoes that fit well. Always wear socks with shoes. Wear extra socks or use tape, dressings, or pads over blister-prone areas as needed. You may also apply petroleum jelly under dressings in blister-prone areas. Wear protective gear, such as gloves, when taking part in sports or activities that can cause blisters. Wear loose-fitting, moisture-wicking clothes when taking part in sports or activities.  Use powders as needed to keep your feet dry. Contact a health care provider if: You have more redness, swelling, or pain around your blister. You have more fluid or blood coming from your blister. Your blister feels warm to the touch. You have pus or a bad smell coming from your blister. You  have a fever or chills. Your blister gets better and then it gets worse. Summary A blister is a raised bubble of skin filled with liquid. Blisters often develop on skin that rubs or presses against another surface repeatedly (friction blister). Most blisters break open, dry up, and go away on their own within 1-2 weeks. Keep your blister clean and dry. This helps to prevent infection. Take steps to help prevent blisters that are caused by friction. Contact a health care provider if you have signs of infection. This information is not intended to replace advice given to you by your health care provider. Make sure you discuss any questions you have with your healthcare provider. Document Revised: 10/01/2019 Document Reviewed: 10/01/2019 Elsevier Patient Education  2022 [employer].

## 2021-05-30 NOTE — Progress Notes (Signed)
   Subjective:    Patient ID: Catherine Jensen, female    DOB: 12/03/1950, 70 y.o.   MRN: [id number]  Chief Complaint: ? blister on bottom   HPI Patient comes in c/o blister on her tail bone. 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 She says the blister appeared after seating o crate for more then 6 hours for 2 day sin a row. Is sore to seat on.     Review of Systems  Constitutional:  Negative for diaphoresis.  Eyes:  Negative for pain.  Respiratory:  Negative for shortness of breath.   Cardiovascular:  Negative for chest pain, palpitations and leg swelling.  Gastrointestinal:  Negative for abdominal pain.  Endocrine: Negative for polydipsia.  Skin:  Negative for rash.  Neurological:  Negative for dizziness, weakness and headaches.  Hematological:  Does not bruise/bleed easily.  All other systems reviewed and are negative.     Objective:   Physical Exam Vitals reviewed.  Constitutional:      Appearance: Normal appearance.  Cardiovascular:     Rate and Rhythm: Normal rate and regular rhythm.     Heart sounds: Normal heart sounds.  Pulmonary:     Effort: Pulmonary effort is normal.     Breath sounds: Normal breath sounds.  Skin:    General: Skin is dry.     Findings: Lesion present.  Neurological:     General: No focal deficit present.     Mental Status: She is alert and oriented to person, place, and time.  Psychiatric:        Mood and Affect: Mood normal.        Behavior: Behavior normal.   BP (!) 154/85   Pulse (!) 110   Temp 98.3 F (36.8 C) (Temporal)   Resp 20   Ht 4\' 11"  (1.499 m)   Wt 89 lb (40.4 kg)   LMP 02/24/1990 Comment: [facility] does pap,[name] CNM  SpO2 94%   BMI 17.98 kg/m   Vesicular lesion on left side on anal cleft- popped with 18g needle- clear fluid drained.       Assessment & Plan:  Catherine Jensen in today with chief complaint of ? blister on bottom   1. Vesicular skin lesions Meds ordered this  encounter  Medications   sulfamethoxazole-trimethoprim (BACTRIM DS) 800-160 MG tablet    Sig: Take 1 tablet by mouth 2 (two) times daily.    Dispense:  14 tablet    Refill:  0    Order Specific Question:   Supervising Provider    Answer:   Caryl Pina A [[id number]]   Keep area clean and dry Try to avoid pressure on area Follow up prn    The above assessment and management plan was discussed with the patient. The patient verbalized understanding of and has agreed to the management plan. Patient is aware to call the clinic if symptoms persist or worsen. Patient is aware when to return to the clinic for a follow-up visit. Patient educated on when it is appropriate to go to the emergency department.   [place]-Margaret Hassell Done, FNP

## 2021-07-05 ENCOUNTER — Other Ambulatory Visit: Payer: Self-pay

## 2021-07-05 ENCOUNTER — Other Ambulatory Visit: Payer: Self-pay | Admitting: Obstetrics and Gynecology

## 2021-07-05 ENCOUNTER — Other Ambulatory Visit: Payer: Self-pay | Admitting: Family

## 2021-07-05 DIAGNOSIS — Z1231 Encounter for screening mammogram for malignant neoplasm of breast: Secondary | ICD-10-CM

## 2021-07-27 ENCOUNTER — Encounter: Payer: Self-pay | Admitting: Family Medicine

## 2021-07-27 ENCOUNTER — Ambulatory Visit (INDEPENDENT_AMBULATORY_CARE_PROVIDER_SITE_OTHER): Payer: Medicare Other | Admitting: Family Medicine

## 2021-07-27 DIAGNOSIS — R0609 Other forms of dyspnea: Secondary | ICD-10-CM

## 2021-07-27 DIAGNOSIS — R0600 Dyspnea, unspecified: Secondary | ICD-10-CM | POA: Diagnosis not present

## 2021-07-27 DIAGNOSIS — U071 COVID-19: Secondary | ICD-10-CM | POA: Diagnosis not present

## 2021-07-27 DIAGNOSIS — J449 Chronic obstructive pulmonary disease, unspecified: Secondary | ICD-10-CM

## 2021-07-27 MED ORDER — MOLNUPIRAVIR EUA 200MG CAPSULE: 4.0000 | ORAL_CAPSULE | Freq: Two times a day (BID) | ORAL | 0 refills | Status: AC

## 2021-07-27 MED ORDER — AZITHROMYCIN 250 MG PO TABS: ORAL_TABLET | ORAL | 0 refills | Status: DC

## 2021-07-27 MED ORDER — DEXAMETHASONE 6 MG PO TABS: 6.0000 mg | ORAL_TABLET | Freq: Two times a day (BID) | ORAL | 0 refills | Status: DC

## 2021-07-27 NOTE — Progress Notes (Signed)
Subjective:    Patient ID: Catherine Jensen, female    DOB: 1951/08/29, 70 y.o.   MRN: [id number]   HPI: Catherine Jensen is a 70 y.o. female presenting for covid test positive with aches all over. Dyspneic with walking in the house. Has to sit down to get relief. Chest congested. Throat is sore. Onset was yesterday morning. FEver present - subjective. Feels really cold. Suffers from COPD.  Refuses all inhalers due to her sister getting cancer from an inhaler.   Depression screen [facility] 2/9 01/26/2021 11/21/2020 11/18/2019 08/06/2019 01/14/2019  Decreased Interest 0 0 0 0 0  Down, Depressed, Hopeless 0 0 0 0 0  PHQ - 2 Score 0 0 0 0 0     Relevant past medical, surgical, family and social history reviewed and updated as indicated.  Interim medical history since our last visit reviewed. Allergies and medications reviewed and updated.  ROS:  Review of Systems  Constitutional:  Negative for activity change, appetite change, chills and fever.  HENT:  Positive for congestion, postnasal drip, rhinorrhea and sinus pressure. Negative for ear discharge, ear pain, hearing loss, nosebleeds, sneezing and trouble swallowing.   Respiratory:  Negative for chest tightness and shortness of breath.   Cardiovascular:  Negative for chest pain and palpitations.  Skin:  Negative for rash.    Social History   Tobacco Use  Smoking Status Former   Packs/day: 0.50   Years: 21.00   Pack years: 10.50   Types: Cigarettes   Quit date: 11/26/2008   Years since quitting: 12.6  Smokeless Tobacco Never       Objective:     Wt Readings from Last 3 Encounters:  05/30/21 89 lb (40.4 kg)  02/10/21 93 lb 1 oz (42.2 kg)  01/26/21 93 lb 9.6 oz (42.5 kg)     Exam deferred. Pt. Harboring due to COVID 19. Phone visit performed.   Assessment & Plan:   1. COVID-19 virus infection   2. Dyspnea on exertion     Meds ordered this encounter  Medications   molnupiravir EUA 200 mg CAPS    Sig: Take 4 capsules  (800 mg total) by mouth 2 (two) times daily for 5 days.    Dispense:  40 capsule    Refill:  0   azithromycin (ZITHROMAX Z-PAK) 250 MG tablet    Sig: Take two right away Then one a day for the next 4 days.    Dispense:  6 each    Refill:  0   dexamethasone (DECADRON) 6 MG tablet    Sig: Take 1 tablet (6 mg total) by mouth 2 (two) times daily with a meal.    Dispense:  10 tablet    Refill:  0    No orders of the defined types were placed in this encounter.     Diagnoses and all orders for this visit:  COVID-19 virus infection  Dyspnea on exertion  Other orders -     molnupiravir EUA 200 mg CAPS; Take 4 capsules (800 mg total) by mouth 2 (two) times daily for 5 days. -     azithromycin (ZITHROMAX Z-PAK) 250 MG tablet; Take two right away Then one a day for the next 4 days. -     dexamethasone (DECADRON) 6 MG tablet; Take 1 tablet (6 mg total) by mouth 2 (two) times daily with a meal.   Virtual Visit via telephone Note  I discussed the limitations, risks, security and privacy concerns of  performing an evaluation and management service by telephone and the availability of in person appointments. The patient was identified with two identifiers. Pt.expressed understanding and agreed to proceed. Pt. Is at home. Dr. Livia Snellen is in his office.  Follow Up Instructions:   I discussed the assessment and treatment plan with the patient. The patient was provided an opportunity to ask questions and all were answered. The patient agreed with the plan and demonstrated an understanding of the instructions.   The patient was advised to call back or seek an in-person evaluation if the symptoms worsen or if the condition fails to improve as anticipated.   Total minutes including chart review and phone contact time: 12   Follow up plan: No follow-ups on file.  Claretta Fraise, MD [place]

## 2021-08-14 ENCOUNTER — Encounter: Payer: Self-pay | Admitting: Family

## 2021-08-14 ENCOUNTER — Other Ambulatory Visit: Payer: Self-pay

## 2021-08-14 ENCOUNTER — Ambulatory Visit (INDEPENDENT_AMBULATORY_CARE_PROVIDER_SITE_OTHER): Payer: Medicare Other | Admitting: Family

## 2021-08-14 VITALS — BP 138/63 | HR 63 | Temp 97.8°F | Ht 59.0 in | Wt 90.2 lb

## 2021-08-14 DIAGNOSIS — J449 Chronic obstructive pulmonary disease, unspecified: Secondary | ICD-10-CM | POA: Diagnosis not present

## 2021-08-14 DIAGNOSIS — M8580 Other specified disorders of bone density and structure, unspecified site: Secondary | ICD-10-CM

## 2021-08-14 LAB — CMP14+EGFR
ALT: 14 IU/L (ref 0–32)
AST: 20 IU/L (ref 0–40)
Albumin/Globulin Ratio: 2.3 — ABNORMAL HIGH (ref 1.2–2.2)
Albumin: 4.5 g/dL (ref 3.8–4.8)
Alkaline Phosphatase: 76 IU/L (ref 44–121)
BUN/Creatinine Ratio: 12 (ref 12–28)
BUN: 7 mg/dL — ABNORMAL LOW (ref 8–27)
Bilirubin Total: 0.4 mg/dL (ref 0.0–1.2)
CO2: 27 mmol/L (ref 20–29)
Calcium: 10 mg/dL (ref 8.7–10.3)
Chloride: 102 mmol/L (ref 96–106)
Creatinine, Ser: 0.6 mg/dL (ref 0.57–1.00)
Globulin, Total: 2 g/dL (ref 1.5–4.5)
Glucose: 92 mg/dL (ref 65–99)
Potassium: 4.2 mmol/L (ref 3.5–5.2)
Sodium: 141 mmol/L (ref 134–144)
Total Protein: 6.5 g/dL (ref 6.0–8.5)
eGFR: 97 mL/min/{1.73_m2} (ref >59)

## 2021-08-14 LAB — CBC WITH DIFFERENTIAL/PLATELET
Basophils Absolute: 0.1 10*3/uL (ref 0.0–0.2)
Basos: 1 %
EOS (ABSOLUTE): 0.1 10*3/uL (ref 0.0–0.4)
Eos: 3 %
Hematocrit: 40.6 % (ref 34.0–46.6)
Hemoglobin: 13.9 g/dL (ref 11.1–15.9)
Immature Grans (Abs): 0 10*3/uL (ref 0.0–0.1)
Immature Granulocytes: 0 %
Lymphocytes Absolute: 1.2 10*3/uL (ref 0.7–3.1)
Lymphs: 26 %
MCH: 32.2 pg (ref 26.6–33.0)
MCHC: 34.2 g/dL (ref 31.5–35.7)
MCV: 94 fL (ref 79–97)
Monocytes Absolute: 0.4 10*3/uL (ref 0.1–0.9)
Monocytes: 9 %
Neutrophils Absolute: 2.7 10*3/uL (ref 1.4–7.0)
Neutrophils: 61 %
Platelets: 201 10*3/uL (ref 150–450)
RBC: 4.32 x10E6/uL (ref 3.77–5.28)
RDW: 11.2 % — ABNORMAL LOW (ref 11.7–15.4)
WBC: 4.4 10*3/uL (ref 3.4–10.8)

## 2021-08-14 NOTE — Progress Notes (Signed)
   Subjective:    Patient ID: Catherine Jensen, female    DOB: [birth year], 70 y.o.   MRN: [id number]  Chief Complaint  Patient presents with   Medical Management of Chronic Issues    HPI Pt presents to the office today for chronic follow up. Pt currently not taking any prescription medications, but does take several vitamins. Pt denies any headache, palpitations, SOB, or edema at this time. She continues to play golf 2-3 times a week during the summer.     She has COPD, but does not take any inhalers. She quit smoking about 8-9 years ago.   Her last lab work showed hypercalcemia, but was cleared by Hematologists. Had a negative CT chest.  She has osteopenia, but has stopped her calcium and vit D.     Review of Systems  All other systems reviewed and are negative.     Objective:   Physical Exam Vitals reviewed.  Constitutional:      General: She is not in acute distress.    Appearance: She is well-developed.  HENT:     Head: Normocephalic and atraumatic.     Right Ear: Tympanic membrane normal.     Left Ear: Tympanic membrane normal.  Eyes:     Pupils: Pupils are equal, round, and reactive to light.  Neck:     Thyroid: No thyromegaly.  Cardiovascular:     Rate and Rhythm: Normal rate and regular rhythm.     Heart sounds: Normal heart sounds. No murmur heard. Pulmonary:     Effort: Pulmonary effort is normal. No respiratory distress.     Breath sounds: Normal breath sounds. No wheezing.  Abdominal:     General: Bowel sounds are normal. There is no distension.     Palpations: Abdomen is soft.     Tenderness: There is no abdominal tenderness.  Musculoskeletal:        General: No tenderness. Normal range of motion.     Cervical back: Normal range of motion and neck supple.  Skin:    General: Skin is warm and dry.  Neurological:     Mental Status: She is alert and oriented to person, place, and time.     Cranial Nerves: No cranial nerve deficit.     Deep Tendon  Reflexes: Reflexes are normal and symmetric.  Psychiatric:        Behavior: Behavior normal.        Thought Content: Thought content normal.        Judgment: Judgment normal.         BP 138/63   Pulse 63   Temp 97.8 F (36.6 C) (Temporal)   Ht _0  (1.499 m)   Wt 90 lb 3.2 oz (40.9 kg)   LMP 02/24/1990 Comment: [facility] does pap,[name] CNM  BMI 18.22 kg/m   Assessment & Plan:  .Catherine Jensen comes in today with chief complaint of Medical Management of Chronic Issues   Diagnosis and orders addressed:  1. Chronic obstructive pulmonary disease, unspecified COPD type ([place]) - CMP14+EGFR - CBC with Differential/Platelet  2. Hypercalcemia - CMP14+EGFR - CBC with Differential/Platelet  3. Osteopenia, unspecified location - CMP14+EGFR - CBC with Differential/Platelet   Labs pending Health Maintenance reviewed Diet and exercise encouraged  Follow up plan: 1 year    [name], FNP

## 2021-08-14 NOTE — Patient Instructions (Signed)
Hypercalcemia Hypercalcemia is when the level of calcium in a person's blood is above normal. The body needs calcium to make bones and keep them strong. Calcium also helps the muscles, nerves, brain, and heart work the way they should. Most of the calcium in the body is in the bones. There is also some calcium in the blood. Hypercalcemia can happen when calcium comes out of the bones, or when the kidneys are not able to remove calcium from the blood. Hypercalcemia can be mild or severe. What are the causes? There are many possible causes of hypercalcemia. Common causes of this condition include: Hyperparathyroidism. This is a condition in which the body produces too much parathyroid hormone. There are four parathyroid glands in your neck. These glands produce a chemical messenger (hormone) that helps the body absorb calcium from foods and helps your bones release calcium. Certain kinds of cancer. Less common causes of hypercalcemia include: Getting too much calcium or vitamin D from your diet. Kidney failure. Hyperthyroidism. Severe dehydration. Being on bed rest or being inactive for a long time. Certain medicines. Infections. What increases the risk? You are more likely to develop this condition if you: Are female. Are 74 years of age or older. Have a family history of hypercalcemia. What are the signs or symptoms? Mild hypercalcemia that starts slowly may not cause symptoms. Severe, sudden hypercalcemia is more likely to cause symptoms, such as: Being more thirsty than usual. Needing to urinate more often than usual. Abdominal pain. Nausea and vomiting. Constipation. Muscle pain, twitching, or weakness. Feeling very tired. How is this diagnosed? Hypercalcemia is usually diagnosed with a blood test. You may also have tests to help determine what is causing this condition, such as imaging tests and more blood tests. How is this treated? Treatment for hypercalcemia depends on the  cause. Treatment may include: Receiving fluids through an IV. Medicines that: Keep calcium levels steady after receiving fluids (loop diuretics). Keep calcium in your bones (bisphosphonates). Lower the calcium level in your blood. Surgery to remove overactive parathyroid glands. A procedure that filters your blood to correct calcium levels (hemodialysis). Follow these instructions at home:  Take over-the-counter and prescription medicines only as told by your health care provider. Follow instructions from your health care provider about eating or drinking restrictions. Drink enough fluid to keep your urine pale yellow. Stay active. Weight-bearing exercise helps to keep calcium in your bones. Follow instructions from your health care provider about what type and level of exercise is safe for you. Keep all follow-up visits as told by your health care provider. This is important. Contact a health care provider if you have: A fever. A heartbeat that is irregular or very fast. Changes in mood, memory, or personality. Get help right away if you: Have severe abdominal pain. Have chest pain. Have trouble breathing. Become very confused and sleepy. Lose consciousness. Summary Hypercalcemia is when the level of calcium in a person's blood is above normal. The body needs calcium to make bones and keep them strong. Calcium also helps the muscles, nerves, brain, and heart work the way they should. There are many possible causes of hypercalcemia, and treatment depends on the cause. Take over-the-counter and prescription medicines only as told by your health care provider. Follow instructions from your health care provider about eating or drinking restrictions. This information is not intended to replace advice given to you by your health care provider. Make sure you discuss any questions you have with your health care provider. Document  Revised: 01/25/2021 Document Reviewed: 08/18/2018 Elsevier  Patient Education  [place].

## 2021-08-24 ENCOUNTER — Other Ambulatory Visit: Payer: Self-pay

## 2021-08-24 ENCOUNTER — Ambulatory Visit
Admission: RE | Admit: 2021-08-24 | Discharge: 2021-08-24 | Disposition: A | Discharge status: Home / Self Care | Payer: Medicare Other | Source: Ambulatory Visit | Attending: Obstetrics and Gynecology | Admitting: Obstetrics and Gynecology

## 2021-08-24 DIAGNOSIS — Z1231 Encounter for screening mammogram for malignant neoplasm of breast: Secondary | ICD-10-CM

## 2021-08-25 DIAGNOSIS — Z8616 Personal history of COVID-19: Secondary | ICD-10-CM

## 2021-08-25 HISTORY — DX: Personal history of COVID-19: Z86.16

## 2021-09-11 ENCOUNTER — Ambulatory Visit: Payer: Medicare Other | Admitting: Obstetrics and Gynecology

## 2021-09-12 ENCOUNTER — Ambulatory Visit (INDEPENDENT_AMBULATORY_CARE_PROVIDER_SITE_OTHER): Payer: Medicare Other

## 2021-09-12 DIAGNOSIS — Z23 Encounter for immunization: Secondary | ICD-10-CM | POA: Diagnosis not present

## 2021-09-12 NOTE — Progress Notes (Signed)
Pt tolerated flu well

## 2021-09-13 ENCOUNTER — Other Ambulatory Visit: Payer: Self-pay

## 2021-09-13 ENCOUNTER — Encounter: Payer: Self-pay | Admitting: Obstetrics and Gynecology

## 2021-09-13 ENCOUNTER — Ambulatory Visit (INDEPENDENT_AMBULATORY_CARE_PROVIDER_SITE_OTHER): Payer: Medicare Other | Admitting: Obstetrics and Gynecology

## 2021-09-13 VITALS — BP 132/70 | HR 76 | Ht 59.0 in | Wt 90.0 lb

## 2021-09-13 DIAGNOSIS — R350 Frequency of micturition: Secondary | ICD-10-CM | POA: Diagnosis not present

## 2021-09-13 DIAGNOSIS — N952 Postmenopausal atrophic vaginitis: Secondary | ICD-10-CM

## 2021-09-13 DIAGNOSIS — Z01419 Encounter for gynecological examination (general) (routine) without abnormal findings: Secondary | ICD-10-CM

## 2021-09-13 NOTE — Patient Instructions (Signed)

## 2021-09-13 NOTE — Progress Notes (Signed)
GYNECOLOGY  VISIT   HPI: 70 y.o.   Married  Caucasian  female   G1P1001 with Patient's last menstrual period was 02/24/1990.   here for breast and pelvic exam.    No vaginal bleeding or discharge.  No bladder or bowel function or control problems.  Voids often.  Drinks a lot of water.  Drinks 7 - 8 caffeine beverages per day.   Having vaginal dryness.   Had Covid in September.  Still with congestion.    Hx elevated calcium.  Had consultation with Oncology, and no evidence of cancer.   Husband had a stroke with Covid.   She had a flu vaccine yesterday.  BMD 11/2020 - osteopenia.  FRAX model - low risk of fracture.   GYNECOLOGIC HISTORY: Patient's last menstrual period was 02/24/1990. Contraception:  Hyst Menopausal hormone therapy:  none Last mammogram:  08-24-21  3D/Neg/Birads1 Last pap smear:  06-09-12 Neg        OB History     Gravida  1   Para  1   Term  1   Preterm      AB      Living  1      SAB      IAB      Ectopic      Multiple      Live Births  1              Patient Active Problem List   Diagnosis Date Noted   Hypercalcemia 02/10/2021   Osteopenia 12/26/2020   Personal history of DVT (deep vein thrombosis) 04/08/2014    Class: History of   COPD (chronic obstructive pulmonary disease) ([place]) 05/22/2011    Past Medical History:  Diagnosis Date   COPD (chronic obstructive pulmonary disease) (H[facility])    Endometriosis    FH: lung cancer    H/O blood clots    in legs   History of COVID-19 08/25/2021   Multiple allergies    pt states she took allergy shots x 20 yrs   Tobacco abuse     Past Surgical History:  Procedure Laterality Date   ABLATION ON ENDOMETRIOSIS     SKIN LESION EXCISION     TOTAL ABDOMINAL HYSTERECTOMY  02/1990   TUBAL LIGATION     TUMOR REMOVAL      Current Outpatient Medications  Medication Sig Dispense Refill   Ascorbic Acid (VITA-C PO) Take by mouth.     aspirin 325 MG tablet Take 325 mg by mouth daily.      BIOTIN PO Take by mouth daily.     Lysine 1000 MG TABS Take 1 tablet by mouth daily.     Multiple Vitamins-Minerals (MULTIVITAMIN WITH MINERALS) tablet Take 1 tablet by mouth daily.     No current facility-administered medications for this visit.     ALLERGIES: Codeine, Musk oil fragrance, and Walnuts [nuts]  Family History  Problem Relation Age of Onset   Heart attack Brother    Diabetes Brother    Kidney failure Brother        53   Lung cancer Sister        w/ mets to brain   Asthma Sister    Crohn's disease Sister    Cancer Sister        lung cancer   Heart disease Mother    Diabetes Mother     Social History   Socioeconomic History   Marital status: Married    Spouse name: [name]  Number of children: 1   Years of education: 12   Highest education level: 12th grade  Occupational History   Occupation: Armed forces training and education officer: [employer]    Comment: exposed to cotton dust @ work  Tobacco Use   Smoking status: Former    Packs/day: 0.50    Years: 21.00    Pack years: 10.50    Types: Cigarettes    Quit date: 11/26/2008    Years since quitting: 12.8   Smokeless tobacco: Never  Vaping Use   Vaping Use: Never used  Substance and Sexual Activity   Alcohol use: No   Drug use: No   Sexual activity: Not Currently    Partners: Male    Birth control/protection: Surgical    Comment: hysterectomy  Other Topics Concern   Not on file  Social History Narrative   Not on file   Social Determinants of Health   Financial Resource Strain: Not on file  Food Insecurity: Not on file  Transportation Needs: Not on file  Physical Activity: Not on file  Stress: Not on file  Social Connections: Not on file  Intimate Partner Violence: Not on file    Review of Systems  All other systems reviewed and are negative.  PHYSICAL EXAMINATION:    BP 132/70   Pulse 76   Ht 4\' 11"  (1.499 m)   Wt 90 lb (40.8 kg)   LMP 02/24/1990 Comment: [facility] does pap,[name]  CNM  SpO2 98%   BMI 18.18 kg/m     General appearance: alert, cooperative and appears stated age Head: Normocephalic, without obvious abnormality, atraumatic Neck: no adenopathy, supple, symmetrical, trachea midline and thyroid normal to inspection and palpation Lungs: clear to auscultation bilaterally Breasts: normal appearance, no masses or tenderness, No nipple retraction or dimpling, No nipple discharge or bleeding, No axillary or supraclavicular adenopathy Heart: regular rate and rhythm Abdomen: soft, non-tender, no masses,  no organomegaly Extremities: extremities normal, atraumatic, no cyanosis or edema Skin: Skin color, texture, turgor normal. No rashes or lesions Lymph nodes: Cervical, supraclavicular, and axillary nodes normal. No abnormal inguinal nodes palpated Neurologic: Grossly normal  Pelvic: External genitalia:  no lesions              Urethra:  normal appearing urethra with no masses, tenderness or lesions              Bartholins and Skenes: normal                 Vagina: normal appearing vagina with normal color and discharge, no lesions              Cervix:  absent                Bimanual Exam:  Uterus:  absent              Adnexa: no mass, fullness, tenderness              Rectal exam: yes.  Confirms.              Anus:  normal sphincter tone, no lesions  Chaperone was present for exam:  yes.  ASSESSMENT  Well woman with GYN exam.  Status post TAH.  Ovaries remain.  Vaginal atrophy.  Osteopenia.   PCP monitoring.  Urinary frequency.  Elevated calcium. Heavy caffeine use.  Hx blood clot in legs.    PLAN  Pap not indicated.  Yearly mammogram.  Self breast exam encouraged.  Vaginal  hydration with water based lubricants, cooking oils, vitamin E discussed.  Not estrogen candidate. Limit caffeine intake.  Fu in 2 years and prn.    An After Visit Summary was printed and given to the patient.  22 min  total time was spent for this patient encounter,  including preparation, face-to-face counseling with the patient, coordination of care, and documentation of the encounter.

## 2021-10-03 IMAGING — MG DIGITAL SCREENING BILAT W/ TOMO W/ CAD
8 series · 9 of 24 positions shown · non-contrast
Comparison: Previous exam(s).

CLINICAL DATA: Screening.

EXAM:
DIGITAL SCREENING BILATERAL MAMMOGRAM WITH TOMO AND CAD

[L CC synth-2D]
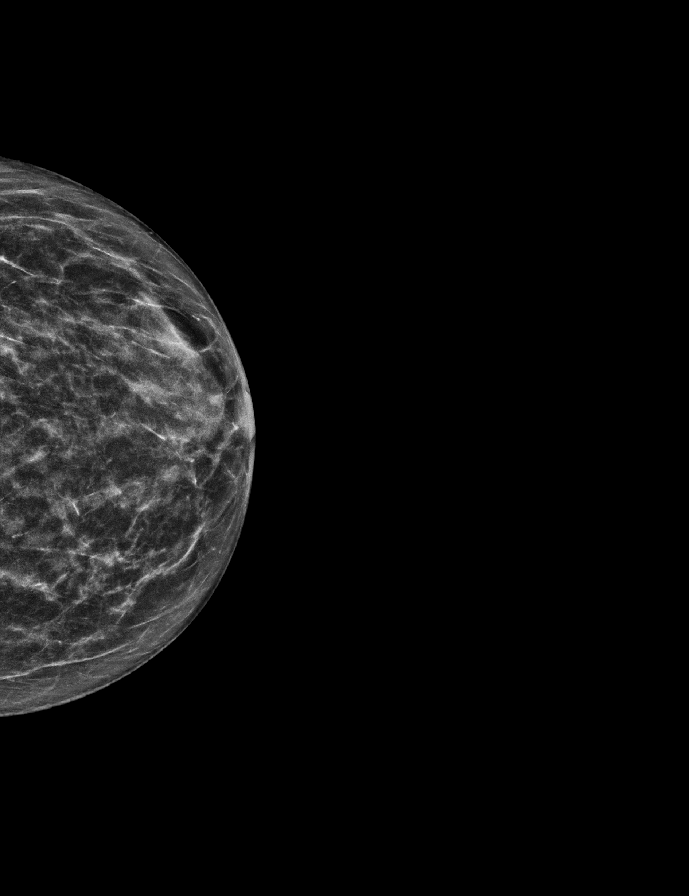

[R MLO synth-2D]
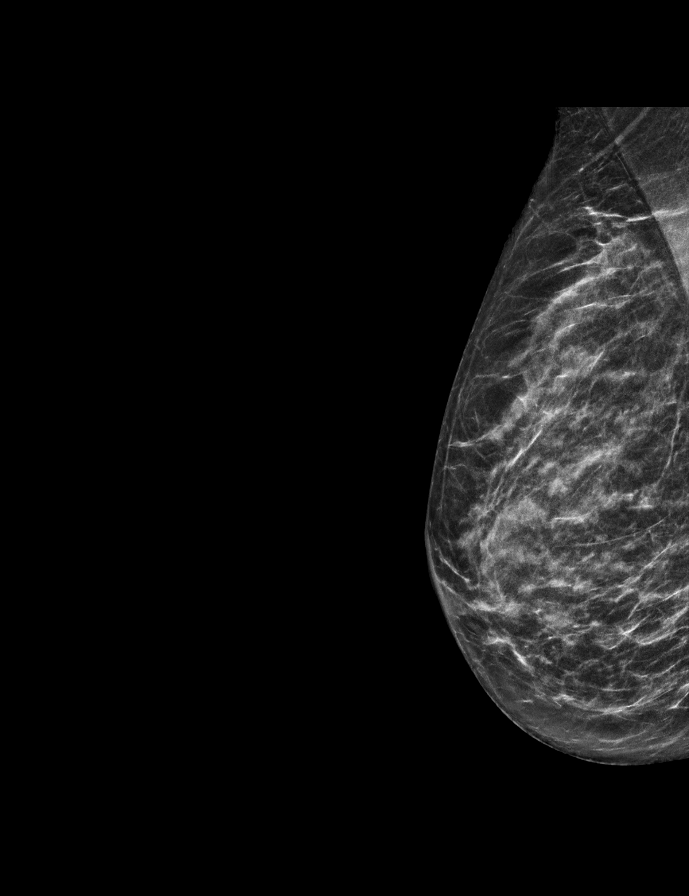

[L MLO synth-2D]
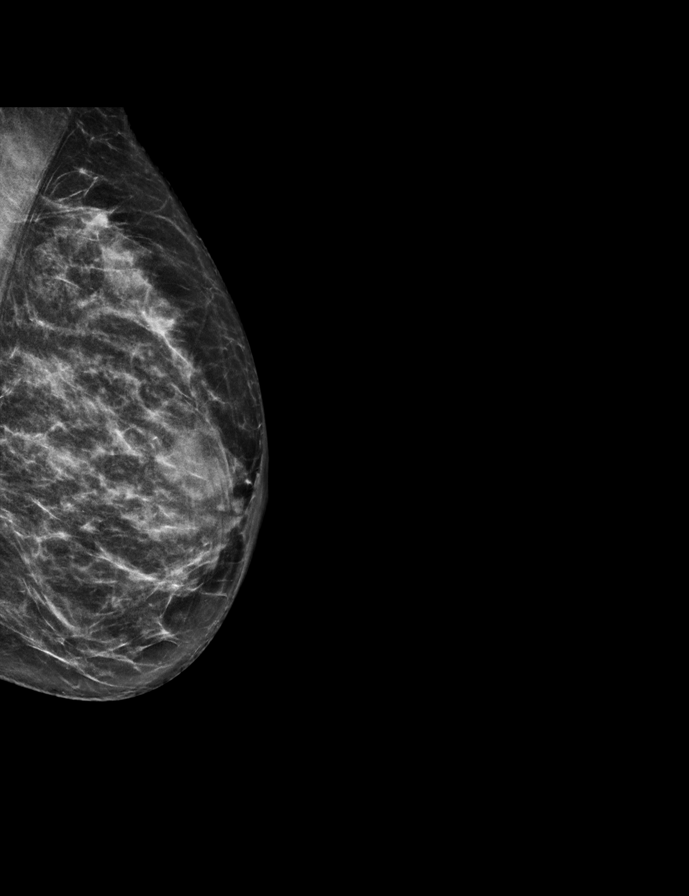

[R CC synth-2D]
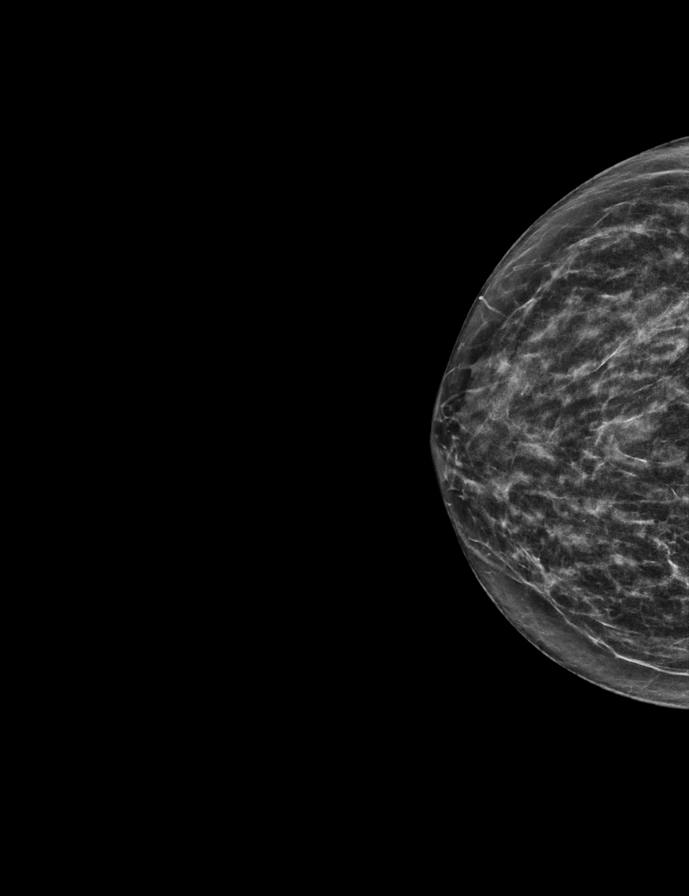

[R CC tomo · 2 of 47 frames shown]
[frame 16/47]
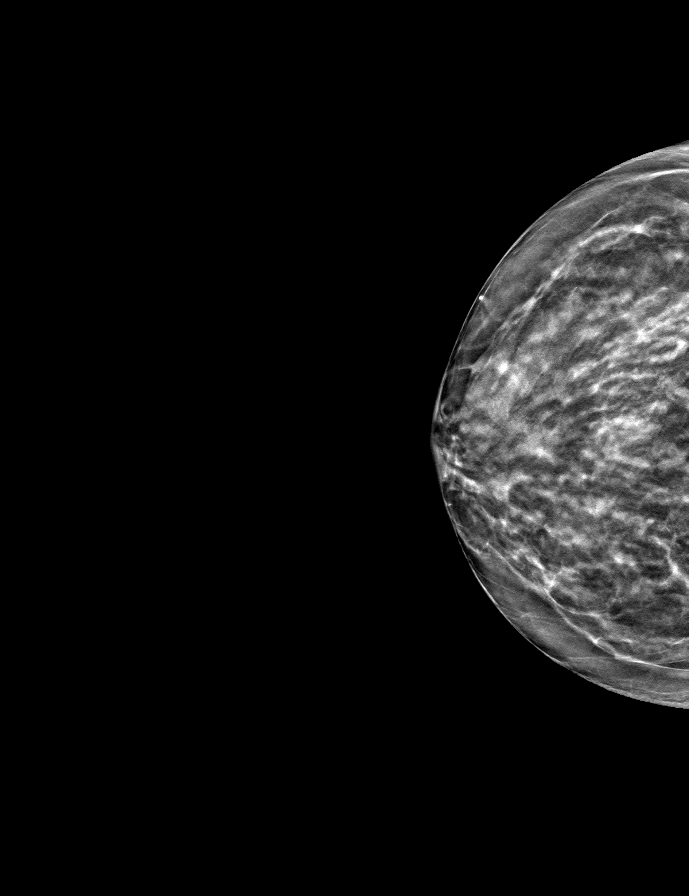
[frame 24/47]
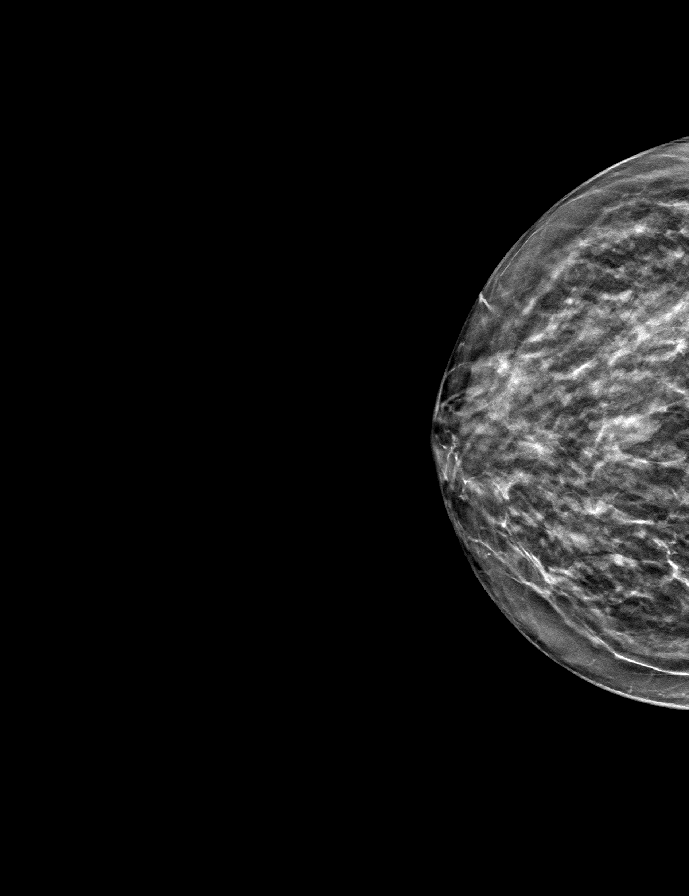

[L CC tomo · tomo slice 25/49.0]
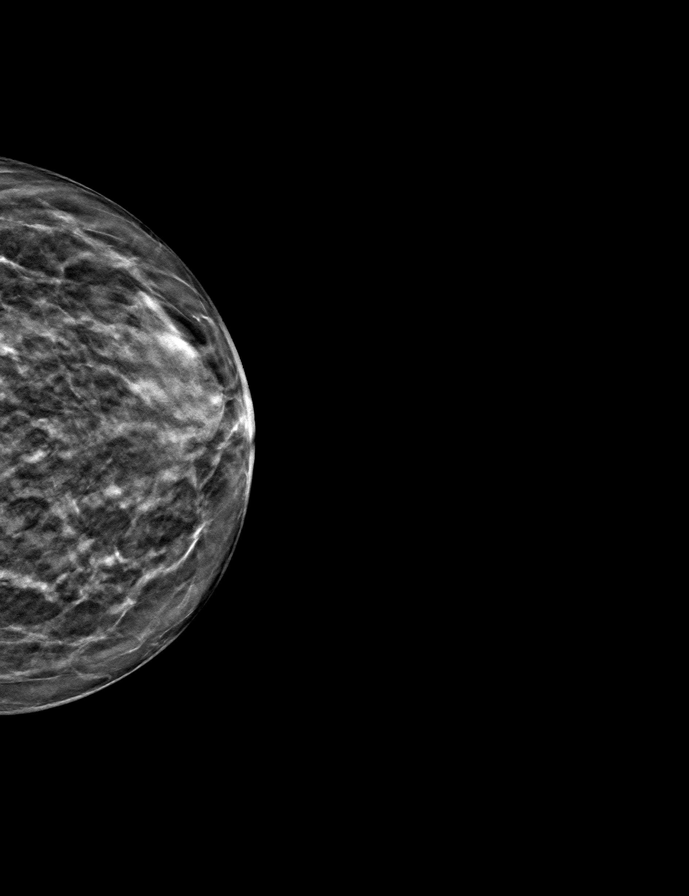

[L MLO tomo · tomo slice 27/54.0]
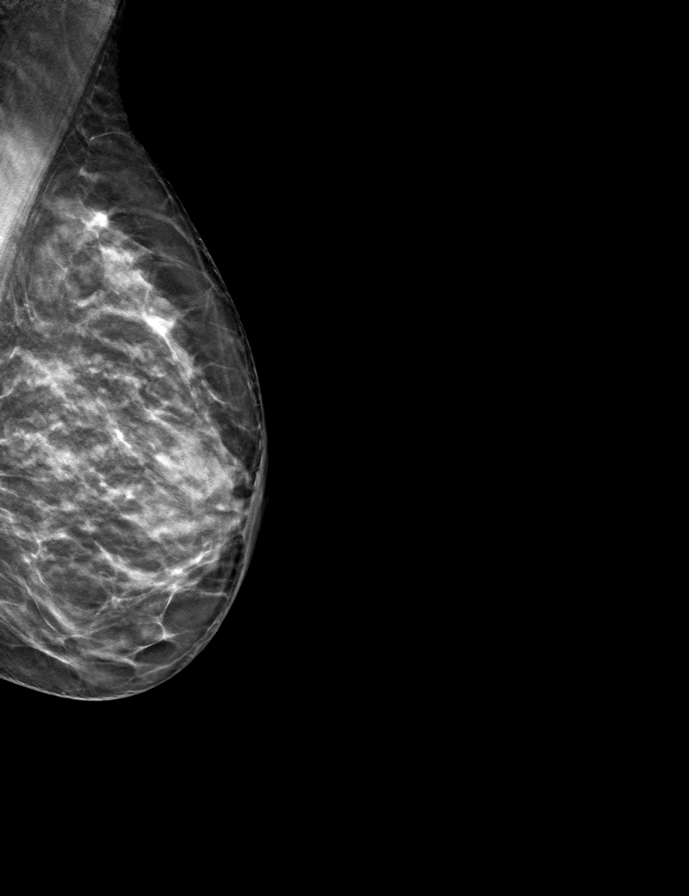

[R MLO tomo · tomo slice 23/46.0]
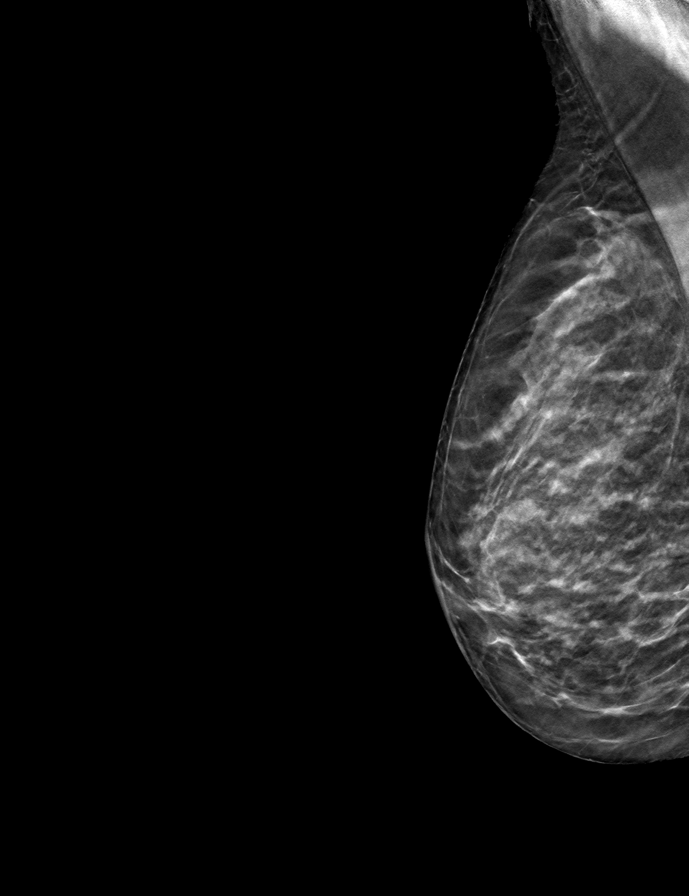

[9 of 24 positions shown; findings below may reference images not displayed]

ACR Breast Density Category c: The breast tissue is heterogeneously
dense, which may obscure small masses.
FINDINGS: There are no findings suspicious for malignancy. Images were
processed with CAD.
IMPRESSION: No mammographic evidence of malignancy. A result letter of this
screening mammogram will be mailed directly to the patient.

RECOMMENDATION:
Screening mammogram in one year. (Code:FT-U-LHB)

BI-RADS CATEGORY  1: Negative.

## 2021-11-22 ENCOUNTER — Ambulatory Visit (INDEPENDENT_AMBULATORY_CARE_PROVIDER_SITE_OTHER): Payer: Medicare Other

## 2021-11-22 VITALS — Ht 59.0 in | Wt 94.0 lb

## 2021-11-22 DIAGNOSIS — Z0000 Encounter for general adult medical examination without abnormal findings: Secondary | ICD-10-CM

## 2021-11-22 NOTE — Progress Notes (Signed)
Subjective:   Catherine Jensen is a 70 y.o. female who presents for Medicare Annual (Subsequent) preventive examination.  Virtual Visit via Telephone Note  I connected with  Catherine Jensen on 11/22/21 at  8:15 AM EST by telephone and verified that I am speaking with the correct person using two identifiers.  Location: Patient: Home Provider: [facility] Persons participating in the virtual visit: patient/Nurse Health Advisor   I discussed the limitations, risks, security and privacy concerns of performing an evaluation and management service by telephone and the availability of in person appointments. The patient expressed understanding and agreed to proceed.  Interactive audio and video telecommunications were attempted between this nurse and patient, however failed, due to patient having technical difficulties OR patient did not have access to video capability.  We continued and completed visit with audio only.  Some vital signs may be absent or patient reported.   Krrish Freund E Kaleya Douse, LPN   Review of Systems     Cardiac Risk Factors include: advanced age (>52men, >41 women);Other (see comment), Risk factor comments: COPD, hx of DVT     Objective:    Today's Vitals   11/22/21 0816  Weight: 94 lb (42.6 kg)  Height: 4\' 11"  (1.499 m)   Body mass index is 18.99 kg/m.  Advanced Directives 11/22/2021 02/10/2021 11/21/2020 11/18/2019 07/09/2018 01/09/2017  Does Patient Have a Medical Advance Directive? No No No No No No  Would patient like information on creating a medical advance directive? No - Patient declined No - Patient declined No - Patient declined No - Patient declined No - Patient declined No - Patient declined    Current Medications (verified) Outpatient Encounter Medications as of 11/22/2021  Medication Sig   Ascorbic Acid (VITA-C PO) Take by mouth.   aspirin 325 MG tablet Take 325 mg by mouth daily.   BIOTIN PO Take by mouth daily.   DENTAGEL 1.1 % GEL dental gel Place  onto teeth at bedtime.   Lysine 1000 MG TABS Take 1 tablet by mouth daily.   Multiple Vitamins-Minerals (MULTIVITAMIN WITH MINERALS) tablet Take 1 tablet by mouth daily.   No facility-administered encounter medications on file as of 11/22/2021.    Allergies (verified) Codeine, Musk oil fragrance, and Walnuts [nuts]   History: Past Medical History:  Diagnosis Date   COPD (chronic obstructive pulmonary disease) (HCC)    Endometriosis    FH: lung cancer    H/O blood clots    in legs   History of COVID-19 08/25/2021   Multiple allergies    pt states she took allergy shots x 20 yrs   Tobacco abuse    Past Surgical History:  Procedure Laterality Date   ABLATION ON ENDOMETRIOSIS     SKIN LESION EXCISION     TOTAL ABDOMINAL HYSTERECTOMY  02/1990   TUBAL LIGATION     TUMOR REMOVAL     Family History  Problem Relation Age of Onset   Heart attack Brother    Diabetes Brother    Kidney failure Brother        59   Lung cancer Sister        w/ mets to brain   Asthma Sister    Crohn's disease Sister    Cancer Sister        lung cancer   Heart disease Mother    Diabetes Mother    Social History   Socioeconomic History   Marital status: Married    Spouse name: [name]  Number of children: 1   Years of education: 12   Highest education level: 12th grade  Occupational History   Occupation: Armed forces training and education officer: [employer]    Comment: exposed to cotton dust @ work  Tobacco Use   Smoking status: Former    Packs/day: 0.50    Years: 21.00    Pack years: 10.50    Types: Cigarettes    Quit date: 11/26/2008    Years since quitting: 12.9   Smokeless tobacco: Never  Vaping Use   Vaping Use: Never used  Substance and Sexual Activity   Alcohol use: No   Drug use: No   Sexual activity: Not Currently    Partners: Male    Birth control/protection: Surgical    Comment: hysterectomy  Other Topics Concern   Not on file  Social History Narrative   One bio child - 2 step children    Still very active and independent   Enjoys Marketing executive   Social Determinants of Radio broadcast assistant Strain: Low Risk    Difficulty of Paying Living Expenses: Not hard at all  Food Insecurity: No Food Insecurity   Worried About Charity fundraiser in the Last Year: Never true   Ran Out of Food in the Last Year: Never true  Transportation Needs: No Transportation Needs   Lack of Transportation (Medical): No   Lack of Transportation (Non-Medical): No  Physical Activity: Insufficiently Active   Days of Exercise per Week: 7 days   Minutes of Exercise per Session: 20 min  Stress: No Stress Concern Present   Feeling of Stress : Not at all  Social Connections: Moderately Integrated   Frequency of Communication with Friends and Family: More than three times a week   Frequency of Social Gatherings with Friends and Family: More than three times a week   Attends Religious Services: Never   Marine scientist or Organizations: Yes   Attends Music therapist: More than 4 times per year   Marital Status: Married    Tobacco Counseling Counseling given: Not Answered   Clinical Intake:  Pre-visit preparation completed: Yes  Pain : No/denies pain     BMI - recorded: 18.99 Nutritional Status: BMI <19  Underweight Nutritional Risks: None Diabetes: No  How often do you need to have someone help you when you read instructions, pamphlets, or other written materials from your doctor or pharmacy?: 1 - Never  Diabetic? no  Interpreter Needed?: No  Information entered by :: Kenzley Ke, LPN   Activities of Daily Living In your present state of health, do you have any difficulty performing the following activities: 11/22/2021  Hearing? N  Vision? N  Difficulty concentrating or making decisions? N  Walking or climbing stairs? N  Dressing or bathing? N  Doing errands, shopping? N  Preparing Food and eating ? N  Using the Toilet? N  In the past six months,  have you accidently leaked urine? N  Do you have problems with loss of bowel control? N  Managing your Medications? N  Managing your Finances? N  Housekeeping or managing your Housekeeping? N  Some recent data might be hidden    Patient Care Team: Sharion Balloon, FNP as PCP - General (Family Medicine) Ubaldo Glassing, Marny Lowenstein, MD (Inactive) as Referring Physician (Obstetrics and Gynecology)  Indicate any recent Medical Services you may have received from other than [facility] providers in the past year (date may be approximate).  Assessment:   This is a routine wellness examination for [facility].  Hearing/Vision screen Hearing Screening - Comments:: Denies hearing difficulties  Vision Screening - Comments:: Wears rx glasses prn reading - up to date with annual eye exams with [facility]  Dietary issues and exercise activities discussed: Current Exercise Habits: Home exercise routine, Type of exercise: strength training/weights;walking;Other - see comments (golfing, lifting 5 lb weights), Time (Minutes): 20, Frequency (Times/Week): 7, Weekly Exercise (Minutes/Week): 140, Intensity: Mild, Exercise limited by: respiratory conditions(s)   Goals Addressed             This Visit's Progress    Exercise 3x per week (30 min per time)   On track    Have 3 meals a day   On track      Depression Screen PHQ 2/9 Scores 11/22/2021 08/14/2021 01/26/2021 11/21/2020 11/18/2019 08/06/2019 01/14/2019  PHQ - 2 Score 0 0 0 0 0 0 0  PHQ- 9 Score - 0 - - - - -    Fall Risk Fall Risk  11/22/2021 08/14/2021 01/26/2021 11/21/2020 11/18/2019  Falls in the past year? 0 0 0 0 0  Number falls in past yr: 0 - - - -  Injury with Fall? 0 - - - -  Risk for fall due to : No Fall Risks - - - -  Follow up Falls prevention discussed - - - -    FALL RISK PREVENTION PERTAINING TO THE HOME:  Any stairs in or around the home? Yes  If so, are there any without handrails? No  Home free of loose throw rugs in walkways, pet beds,  electrical cords, etc? Yes  Adequate lighting in your home to reduce risk of falls? Yes   ASSISTIVE DEVICES UTILIZED TO PREVENT FALLS:  Life alert? No  Use of a cane, walker or w/c? No  Grab bars in the bathroom? Yes  Shower chair or bench in shower? Yes  Elevated toilet seat or a handicapped toilet? Yes   TIMED UP AND GO:  Was the test performed? No . Telephonic visit  Cognitive Function: Normal cognitive status assessed by direct observation by this Nurse Health Advisor. No abnormalities found.    MMSE - Mini Mental State Exam 07/09/2018 01/09/2017  Orientation to time 5 5  Orientation to Place 5 5  Registration 3 3  Attention/ Calculation 5 5  Recall 1 3  Language- name 2 objects 2 2  Language- repeat 1 1  Language- follow 3 step command 3 3  Language- read & follow direction 1 1  Write a sentence 1 1  Copy design 1 1  Total score 28 30     6CIT Screen 11/21/2020 11/18/2019  What Year? 0 points 0 points  What month? 0 points 0 points  What time? 0 points 0 points  Count back from 20 0 points 0 points  Months in reverse 2 points 2 points  Repeat phrase 0 points 0 points  Total Score 2 2    Immunizations Immunization History  Administered Date(s) Administered   Fluad Quad(high Dose 65+) 09/01/2020, 09/12/2021   Influenza, High Dose Seasonal PF 09/29/2018   Influenza,inj,Quad PF,6+ Mos 09/14/2013, 01/09/2017, 09/04/2017, 10/14/2019   Moderna Sars-Covid-2 Vaccination 12/23/2019, 01/20/2020, 11/07/2020   Pneumococcal Conjugate-13 01/09/2017   Pneumococcal Polysaccharide-23 05/23/2011, 07/24/2018   Pneumococcal-Unspecified 04/08/2013   Tdap 04/18/2016    TDAP status: Up to date  Flu Vaccine status: Up to date  Pneumococcal vaccine status: Up to date  Covid-19 vaccine status: Completed vaccines  Qualifies for Shingles Vaccine? Yes   Zostavax completed No   Shingrix Completed?: No.    Education has been provided regarding the importance of this vaccine.  Patient has been advised to call insurance company to determine out of pocket expense if they have not yet received this vaccine. Advised may also receive vaccine at local pharmacy or Health Dept. Verbalized acceptance and understanding.  Screening Tests Health Maintenance  Topic Date Due   Zoster Vaccines- Shingrix (1 of 2) Never done   COVID-19 Vaccine (4 - Booster for Moderna series) 01/02/2021   Fecal DNA (Cologuard)  08/19/2021   MAMMOGRAM  08/25/2023   TETANUS/TDAP  04/18/2026   Pneumonia Vaccine 20+ Years old  Completed   INFLUENZA VACCINE  Completed   DEXA SCAN  Completed   Hepatitis C Screening  Completed   HPV VACCINES  Aged Out    Health Maintenance  Health Maintenance Due  Topic Date Due   Zoster Vaccines- Shingrix (1 of 2) Never done   COVID-19 Vaccine (4 - Booster for Moderna series) 01/02/2021   Fecal DNA (Cologuard)  08/19/2021    Colorectal cancer screening: Type of screening: Cologuard. Completed 08/19/2018. Repeat every 3 years  Mammogram status: Completed 08/24/2021. Repeat every year  Bone Density status: Completed 12/23/2020. Results reflect: Bone density results: OSTEOPENIA. Repeat every 2 years.  Lung Cancer Screening: (Low Dose CT Chest recommended if Age 72-80 years, 30 pack-year currently smoking OR have quit w/in 15years.) does not qualify.   Additional Screening:  Hepatitis C Screening: does qualify; Completed 04/18/2016  Vision Screening: Recommended annual ophthalmology exams for early detection of glaucoma and other disorders of the eye. Is the patient up to date with their annual eye exam?  Yes  Who is the provider or what is the name of the office in which the patient attends annual eye exams? [facility] If pt is not established with a provider, would they like to be referred to a provider to establish care? No .   Dental Screening: Recommended annual dental exams for proper oral hygiene  Community Resource Referral / Chronic Care Management: CRR  required this visit?  No   CCM required this visit?  No      Plan:     I have personally reviewed and noted the following in the patients chart:   Medical and social history Use of alcohol, tobacco or illicit drugs  Current medications and supplements including opioid prescriptions.  Functional ability and status Nutritional status Physical activity Advanced directives List of other physicians Hospitalizations, surgeries, and ER visits in previous 12 months Vitals Screenings to include cognitive, depression, and falls Referrals and appointments  In addition, I have reviewed and discussed with patient certain preventive protocols, quality metrics, and best practice recommendations. A written personalized care plan for preventive services as well as general preventive health recommendations were provided to patient.     Sandrea Hammond, LPN   [id number]   Nurse Notes: None

## 2021-11-22 NOTE — Patient Instructions (Signed)
Catherine Jensen , Thank you for taking time to come for your Medicare Wellness Visit. I appreciate your ongoing commitment to your health goals. Please review the following plan we discussed and let me know if I can assist you in the future.   Screening recommendations/referrals: Colonoscopy: Cologuard done 08/19/2018 - Recommend repeat every 3 years - declined at this time Mammogram: done 08/24/2021 - Repeat annually  Bone Density: Done 12/23/2020 - Repeat every 2 years  Recommended yearly ophthalmology/optometry visit for glaucoma screening and checkup Recommended yearly dental visit for hygiene and checkup  Vaccinations: Influenza vaccine: Done 09/12/2021 - Repeat annually  Pneumococcal vaccine: Done 01/09/2017 & 07/24/2018 Tdap vaccine: Done 04/18/2016 - Repeat in 10 years Shingles vaccine: Due   Covid-19:Done 12/23/2019, 01/20/2020, & 11/07/2020  Advanced directives: Advance directive discussed with you today. Even though you declined this today, please call our office should you change your mind, and we can give you the proper paperwork for you to fill out.   Conditions/risks identified: Aim for 30 minutes of exercise or brisk walking each day, drink 6-8 glasses of water and eat lots of fruits and vegetables.   Next appointment: Follow up in one year for your annual wellness visit    Preventive Care 65 Years and Older, Female Preventive care refers to lifestyle choices and visits with your health care provider that can promote health and wellness. What does preventive care include? A yearly physical exam. This is also called an annual well check. Dental exams once or twice a year. Routine eye exams. Ask your health care provider how often you should have your eyes checked. Personal lifestyle choices, including: Daily care of your teeth and gums. Regular physical activity. Eating a healthy diet. Avoiding tobacco and drug use. Limiting alcohol use. Practicing safe sex. Taking low-dose  aspirin every day. Taking vitamin and mineral supplements as recommended by your health care provider. What happens during an annual well check? The services and screenings done by your health care provider during your annual well check will depend on your age, overall health, lifestyle risk factors, and family history of disease. Counseling  Your health care provider may ask you questions about your: Alcohol use. Tobacco use. Drug use. Emotional well-being. Home and relationship well-being. Sexual activity. Eating habits. History of falls. Memory and ability to understand (cognition). Work and work Statistician. Reproductive health. Screening  You may have the following tests or measurements: Height, weight, and BMI. Blood pressure. Lipid and cholesterol levels. These may be checked every 5 years, or more frequently if you are over 92 years old. Skin check. Lung cancer screening. You may have this screening every year starting at age 46 if you have a 30-pack-year history of smoking and currently smoke or have quit within the past 15 years. Fecal occult blood test (FOBT) of the stool. You may have this test every year starting at age 28. Flexible sigmoidoscopy or colonoscopy. You may have a sigmoidoscopy every 5 years or a colonoscopy every 10 years starting at age 69. Hepatitis C blood test. Hepatitis B blood test. Sexually transmitted disease (STD) testing. Diabetes screening. This is done by checking your blood sugar (glucose) after you have not eaten for a while (fasting). You may have this done every 1-3 years. Bone density scan. This is done to screen for osteoporosis. You may have this done starting at age 35. Mammogram. This may be done every 1-2 years. Talk to your health care provider about how often you should have regular mammograms.  Talk with your health care provider about your test results, treatment options, and if necessary, the need for more tests. Vaccines  Your  health care provider may recommend certain vaccines, such as: Influenza vaccine. This is recommended every year. Tetanus, diphtheria, and acellular pertussis (Tdap, Td) vaccine. You may need a Td booster every 10 years. Zoster vaccine. You may need this after age 87. Pneumococcal 13-valent conjugate (PCV13) vaccine. One dose is recommended after age 42. Pneumococcal polysaccharide (PPSV23) vaccine. One dose is recommended after age 70. Talk to your health care provider about which screenings and vaccines you need and how often you need them. This information is not intended to replace advice given to you by your health care provider. Make sure you discuss any questions you have with your health care provider. Document Released: 12/09/2015 Document Revised: 08/01/2016 Document Reviewed: 09/13/2015 Elsevier Interactive Patient Education  2017 [employer] Prevention in the Home Falls can cause injuries. They can happen to people of all ages. There are many things you can do to make your home safe and to help prevent falls. What can I do on the outside of my home? Regularly fix the edges of walkways and driveways and fix any cracks. Remove anything that might make you trip as you walk through a door, such as a raised step or threshold. Trim any bushes or trees on the path to your home. Use bright outdoor lighting. Clear any walking paths of anything that might make someone trip, such as rocks or tools. Regularly check to see if handrails are loose or broken. Make sure that both sides of any steps have handrails. Any raised decks and porches should have guardrails on the edges. Have any leaves, snow, or ice cleared regularly. Use sand or salt on walking paths during winter. Clean up any spills in your garage right away. This includes oil or grease spills. What can I do in the bathroom? Use night lights. Install grab bars by the toilet and in the tub and shower. Do not use towel bars as  grab bars. Use non-skid mats or decals in the tub or shower. If you need to sit down in the shower, use a plastic, non-slip stool. Keep the floor dry. Clean up any water that spills on the floor as soon as it happens. Remove soap buildup in the tub or shower regularly. Attach bath mats securely with double-sided non-slip rug tape. Do not have throw rugs and other things on the floor that can make you trip. What can I do in the bedroom? Use night lights. Make sure that you have a light by your bed that is easy to reach. Do not use any sheets or blankets that are too big for your bed. They should not hang down onto the floor. Have a firm chair that has side arms. You can use this for support while you get dressed. Do not have throw rugs and other things on the floor that can make you trip. What can I do in the kitchen? Clean up any spills right away. Avoid walking on wet floors. Keep items that you use a lot in easy-to-reach places. If you need to reach something above you, use a strong step stool that has a grab bar. Keep electrical cords out of the way. Do not use floor polish or wax that makes floors slippery. If you must use wax, use non-skid floor wax. Do not have throw rugs and other things on the floor that can make  you trip. What can I do with my stairs? Do not leave any items on the stairs. Make sure that there are handrails on both sides of the stairs and use them. Fix handrails that are broken or loose. Make sure that handrails are as long as the stairways. Check any carpeting to make sure that it is firmly attached to the stairs. Fix any carpet that is loose or worn. Avoid having throw rugs at the top or bottom of the stairs. If you do have throw rugs, attach them to the floor with carpet tape. Make sure that you have a light switch at the top of the stairs and the bottom of the stairs. If you do not have them, ask someone to add them for you. What else can I do to help prevent  falls? Wear shoes that: Do not have high heels. Have rubber bottoms. Are comfortable and fit you well. Are closed at the toe. Do not wear sandals. If you use a stepladder: Make sure that it is fully opened. Do not climb a closed stepladder. Make sure that both sides of the stepladder are locked into place. Ask someone to hold it for you, if possible. Clearly mark and make sure that you can see: Any grab bars or handrails. First and last steps. Where the edge of each step is. Use tools that help you move around (mobility aids) if they are needed. These include: Canes. Walkers. Scooters. Crutches. Turn on the lights when you go into a dark area. Replace any light bulbs as soon as they burn out. Set up your furniture so you have a clear path. Avoid moving your furniture around. If any of your floors are uneven, fix them. If there are any pets around you, be aware of where they are. Review your medicines with your doctor. Some medicines can make you feel dizzy. This can increase your chance of falling. Ask your doctor what other things that you can do to help prevent falls. This information is not intended to replace advice given to you by your health care provider. Make sure you discuss any questions you have with your health care provider. Document Released: 09/08/2009 Document Revised: 04/19/2016 Document Reviewed: 12/17/2014 Elsevier Interactive Patient Education  2017 [employer].

## 2021-12-01 ENCOUNTER — Ambulatory Visit (INDEPENDENT_AMBULATORY_CARE_PROVIDER_SITE_OTHER): Payer: Medicare Other | Admitting: *Deleted

## 2021-12-01 DIAGNOSIS — Z23 Encounter for immunization: Secondary | ICD-10-CM

## 2021-12-06 DIAGNOSIS — H11043 Peripheral pterygium, stationary, bilateral: Secondary | ICD-10-CM | POA: Diagnosis not present

## 2021-12-06 DIAGNOSIS — H25813 Combined forms of age-related cataract, bilateral: Secondary | ICD-10-CM | POA: Diagnosis not present

## 2021-12-06 DIAGNOSIS — H43392 Other vitreous opacities, left eye: Secondary | ICD-10-CM | POA: Diagnosis not present

## 2021-12-06 DIAGNOSIS — H04123 Dry eye syndrome of bilateral lacrimal glands: Secondary | ICD-10-CM | POA: Diagnosis not present

## 2021-12-22 ENCOUNTER — Telehealth: Payer: Self-pay | Admitting: Family

## 2021-12-25 ENCOUNTER — Other Ambulatory Visit: Payer: Self-pay | Admitting: Family Medicine

## 2021-12-25 DIAGNOSIS — Z1211 Encounter for screening for malignant neoplasm of colon: Secondary | ICD-10-CM

## 2022-01-01 DIAGNOSIS — Z1211 Encounter for screening for malignant neoplasm of colon: Secondary | ICD-10-CM | POA: Diagnosis not present

## 2022-01-05 ENCOUNTER — Encounter: Payer: Self-pay | Admitting: Nurse Practitioner

## 2022-01-05 ENCOUNTER — Ambulatory Visit (INDEPENDENT_AMBULATORY_CARE_PROVIDER_SITE_OTHER): Payer: Medicare Other | Admitting: Nurse Practitioner

## 2022-01-05 VITALS — BP 142/78 | HR 69 | Temp 97.7°F | Resp 20 | Ht 59.0 in | Wt 94.0 lb

## 2022-01-05 DIAGNOSIS — M5432 Sciatica, left side: Secondary | ICD-10-CM

## 2022-01-05 MED ORDER — METHYLPREDNISOLONE ACETATE 40 MG/ML IJ SUSP
40.0000 mg | Freq: Once | INTRAMUSCULAR | Status: AC
  Administered 2022-01-05: 40 mg via INTRAMUSCULAR

## 2022-01-05 MED ORDER — KETOROLAC TROMETHAMINE 60 MG/2ML IM SOLN
60.0000 mg | Freq: Once | INTRAMUSCULAR | Status: AC
  Administered 2022-01-05: 60 mg via INTRAMUSCULAR

## 2022-01-05 MED ORDER — PREDNISONE 20 MG PO TABS: ORAL_TABLET | ORAL | 0 refills | Status: DC

## 2022-01-05 NOTE — Progress Notes (Signed)
° °  Subjective:    Patient ID: Catherine Jensen, female    DOB: 01-07-1951, 71 y.o.   MRN: [id number]   Chief Complaint: Leg Pain (Left leg. )  Patient comes in today c/o left leg pain. Pain radiated down the back of her leg   Leg Pain  Incident onset: Has been hurting for atleast 6 months. There was no injury mechanism. The pain is present in the left leg. The quality of the pain is described as aching and burning. The pain is at a severity of 9/10. The pain is severe. The pain has been Constant since onset. She reports no foreign bodies present. The symptoms are aggravated by weight bearing. She has tried NSAIDs for the symptoms. The treatment provided mild relief.      Review of Systems  Constitutional:  Negative for diaphoresis.  Eyes:  Negative for pain.  Respiratory:  Negative for shortness of breath.   Cardiovascular:  Negative for chest pain, palpitations and leg swelling.  Gastrointestinal:  Negative for abdominal pain.  Endocrine: Negative for polydipsia.  Skin:  Negative for rash.  Neurological:  Negative for dizziness, weakness and headaches.  Hematological:  Does not bruise/bleed easily.  All other systems reviewed and are negative.     Objective:   Physical Exam Vitals reviewed.  Constitutional:      Appearance: Normal appearance.  Cardiovascular:     Rate and Rhythm: Normal rate and regular rhythm.     Heart sounds: Normal heart sounds.  Pulmonary:     Effort: Pulmonary effort is normal.     Breath sounds: Normal breath sounds.  Skin:    General: Skin is warm.  Neurological:     General: No focal deficit present.     Mental Status: She is alert and oriented to person, place, and time.  Psychiatric:        Mood and Affect: Mood normal.        Behavior: Behavior normal.   BP (!) 142/78    Pulse 69    Temp 97.7 F (36.5 C) (Temporal)    Resp 20    Ht 4\' 11"  (1.499 m)    Wt 94 lb (42.6 kg)    LMP 02/24/1990 Comment: [facility] does pap,[name]  CNM   SpO2 98%    BMI 18.99 kg/m          Assessment & Plan:   Catherine Jensen in today with chief complaint of Leg Pain (Left leg. )   1. Sciatica of left side Moist heat Rest stretches - ketorolac (TORADOL) injection 60 mg - methylPREDNISolone acetate (DEPO-MEDROL) injection 40 mg - predniSONE (DELTASONE) 20 MG tablet; 2 po at sametime daily for 5 days-  Dispense: 10 tablet; Refill: 0    The above assessment and management plan was discussed with the patient. The patient verbalized understanding of and has agreed to the management plan. Patient is aware to call the clinic if symptoms persist or worsen. Patient is aware when to return to the clinic for a follow-up visit. Patient educated on when it is appropriate to go to the emergency department.   Samhitha-Margaret Hassell Done, FNP

## 2022-01-05 NOTE — Patient Instructions (Signed)
Sciatica Sciatica is pain, numbness, weakness, or tingling along the path of the sciatic nerve. The sciatic nerve starts in the lower back and runs down the back of each leg. The nerve controls the muscles in the lower leg and in the back of the knee. It also provides feeling (sensation) to the back of the thigh, the lower leg, and the sole of the foot. Sciatica is a symptom of another medical condition that pinches or puts pressure on the sciatic nerve. Sciatica most often only affects one side of the body. Sciatica usually goes away on its own or with treatment. In some cases, sciatica may come back (recur). What are the causes? This condition is caused by pressure on the sciatic nerve or pinching of the nerve. This may be the result of: A disk in between the bones of the spine bulging out too far (herniated disk). Age-related changes in the spinal disks. A pain disorder that affects a muscle in the buttock. Extra bone growth near the sciatic nerve. A break (fracture) of the pelvis. Pregnancy. Tumor. This is rare. What increases the risk? The following factors may make you more likely to develop this condition: Playing sports that place pressure or stress on the spine. Having poor strength and flexibility. A history of back injury or surgery. Sitting for long periods of time. Doing activities that involve repetitive bending or lifting. Obesity. What are the signs or symptoms? Symptoms can vary from mild to very severe, and they may include: Any of these problems in the lower back, leg, hip, or buttock: Mild tingling, numbness, or dull aches. Burning sensations. Sharp pains. Numbness in the back of the calf or the sole of the foot. Leg weakness. Severe back pain that makes movement difficult. Symptoms may get worse when you cough, sneeze, or laugh, or when you sit or stand for long periods of time. How is this diagnosed? This condition may be diagnosed based on: Your symptoms and  medical history. A physical exam. Blood tests. Imaging tests, such as: X-rays. MRI. CT scan. How is this treated? In many cases, this condition improves on its own without treatment. However, treatment may include: Reducing or modifying physical activity. Exercising and stretching. Icing and applying heat to the affected area. Medicines that help to: Relieve pain and swelling. Relax your muscles. Injections of medicines that help to relieve pain, irritation, and inflammation around the sciatic nerve (steroids). Surgery. Follow these instructions at home: Medicines Take over-the-counter and prescription medicines only as told by your health care provider. Ask your health care provider if the medicine prescribed to you: Requires you to avoid driving or using heavy machinery. Can cause constipation. You may need to take these actions to prevent or treat constipation: Drink enough fluid to keep your urine pale yellow. Take over-the-counter or prescription medicines. Eat foods that are high in fiber, such as beans, whole grains, and fresh fruits and vegetables. Limit foods that are high in fat and processed sugars, such as fried or sweet foods. Managing pain   If directed, put ice on the affected area. Put ice in a plastic bag. Place a towel between your skin and the bag. Leave the ice on for 20 minutes, 2-3 times a day. If directed, apply heat to the affected area. Use the heat source that your health care provider recommends, such as a moist heat pack or a heating pad. Place a towel between your skin and the heat source. Leave the heat on for 20-30 minutes. Remove   the heat if your skin turns bright red. This is especially important if you are unable to feel pain, heat, or cold. You may have a greater risk of getting burned. Activity  Return to your normal activities as told by your health care provider. Ask your health care provider what activities are safe for you. Avoid  activities that make your symptoms worse. Take brief periods of rest throughout the day. When you rest for longer periods, mix in some mild activity or stretching between periods of rest. This will help to prevent stiffness and pain. Avoid sitting for long periods of time without moving. Get up and move around at least one time each hour. Exercise and stretch regularly, as told by your health care provider. Do not lift anything that is heavier than 10 lb (4.5 kg) while you have symptoms of sciatica. When you do not have symptoms, you should still avoid heavy lifting, especially repetitive heavy lifting. When you lift objects, always use proper lifting technique, which includes: Bending your knees. Keeping the load close to your body. Avoiding twisting. General instructions Maintain a healthy weight. Excess weight puts extra stress on your back. Wear supportive, comfortable shoes. Avoid wearing high heels. Avoid sleeping on a mattress that is too soft or too hard. A mattress that is firm enough to support your back when you sleep may help to reduce your pain. Keep all follow-up visits as told by your health care provider. This is important. Contact a health care provider if: You have pain that: Wakes you up when you are sleeping. Gets worse when you lie down. Is worse than you have experienced in the past. Lasts longer than 4 weeks. You have an unexplained weight loss. Get help right away if: You are not able to control when you urinate or have bowel movements (incontinence). You have: Weakness in your lower back, pelvis, buttocks, or legs that gets worse. Redness or swelling of your back. A burning sensation when you urinate. Summary Sciatica is pain, numbness, weakness, or tingling along the path of the sciatic nerve. This condition is caused by pressure on the sciatic nerve or pinching of the nerve. Sciatica can cause pain, numbness, or tingling in the lower back, legs, hips, and  buttocks. Treatment often includes rest, exercise, medicines, and applying ice or heat. This information is not intended to replace advice given to you by your health care provider. Make sure you discuss any questions you have with your health care provider. Document Revised: 12/01/2018 Document Reviewed: 12/01/2018 Elsevier Patient Education  2022 [employer]  

## 2022-01-08 LAB — COLOGUARD: COLOGUARD: NEGATIVE

## 2022-02-01 DIAGNOSIS — M9905 Segmental and somatic dysfunction of pelvic region: Secondary | ICD-10-CM | POA: Diagnosis not present

## 2022-02-01 DIAGNOSIS — M5432 Sciatica, left side: Secondary | ICD-10-CM | POA: Diagnosis not present

## 2022-02-01 DIAGNOSIS — M9904 Segmental and somatic dysfunction of sacral region: Secondary | ICD-10-CM | POA: Diagnosis not present

## 2022-02-01 DIAGNOSIS — M9903 Segmental and somatic dysfunction of lumbar region: Secondary | ICD-10-CM | POA: Diagnosis not present

## 2022-02-05 DIAGNOSIS — M9904 Segmental and somatic dysfunction of sacral region: Secondary | ICD-10-CM | POA: Diagnosis not present

## 2022-02-05 DIAGNOSIS — M9905 Segmental and somatic dysfunction of pelvic region: Secondary | ICD-10-CM | POA: Diagnosis not present

## 2022-02-05 DIAGNOSIS — M5432 Sciatica, left side: Secondary | ICD-10-CM | POA: Diagnosis not present

## 2022-02-05 DIAGNOSIS — M9903 Segmental and somatic dysfunction of lumbar region: Secondary | ICD-10-CM | POA: Diagnosis not present

## 2022-02-07 ENCOUNTER — Ambulatory Visit (INDEPENDENT_AMBULATORY_CARE_PROVIDER_SITE_OTHER): Payer: Medicare Other

## 2022-02-07 DIAGNOSIS — M9905 Segmental and somatic dysfunction of pelvic region: Secondary | ICD-10-CM | POA: Diagnosis not present

## 2022-02-07 DIAGNOSIS — M9903 Segmental and somatic dysfunction of lumbar region: Secondary | ICD-10-CM | POA: Diagnosis not present

## 2022-02-07 DIAGNOSIS — M5432 Sciatica, left side: Secondary | ICD-10-CM | POA: Diagnosis not present

## 2022-02-07 DIAGNOSIS — Z23 Encounter for immunization: Secondary | ICD-10-CM

## 2022-02-07 DIAGNOSIS — M9904 Segmental and somatic dysfunction of sacral region: Secondary | ICD-10-CM | POA: Diagnosis not present

## 2022-02-08 DIAGNOSIS — M9903 Segmental and somatic dysfunction of lumbar region: Secondary | ICD-10-CM | POA: Diagnosis not present

## 2022-02-08 DIAGNOSIS — M9905 Segmental and somatic dysfunction of pelvic region: Secondary | ICD-10-CM | POA: Diagnosis not present

## 2022-02-08 DIAGNOSIS — M9904 Segmental and somatic dysfunction of sacral region: Secondary | ICD-10-CM | POA: Diagnosis not present

## 2022-02-08 DIAGNOSIS — M5432 Sciatica, left side: Secondary | ICD-10-CM | POA: Diagnosis not present

## 2022-02-12 DIAGNOSIS — M9905 Segmental and somatic dysfunction of pelvic region: Secondary | ICD-10-CM | POA: Diagnosis not present

## 2022-02-12 DIAGNOSIS — M5432 Sciatica, left side: Secondary | ICD-10-CM | POA: Diagnosis not present

## 2022-02-12 DIAGNOSIS — M9903 Segmental and somatic dysfunction of lumbar region: Secondary | ICD-10-CM | POA: Diagnosis not present

## 2022-02-12 DIAGNOSIS — M9904 Segmental and somatic dysfunction of sacral region: Secondary | ICD-10-CM | POA: Diagnosis not present

## 2022-02-14 DIAGNOSIS — M5432 Sciatica, left side: Secondary | ICD-10-CM | POA: Diagnosis not present

## 2022-02-14 DIAGNOSIS — M9905 Segmental and somatic dysfunction of pelvic region: Secondary | ICD-10-CM | POA: Diagnosis not present

## 2022-02-14 DIAGNOSIS — M9904 Segmental and somatic dysfunction of sacral region: Secondary | ICD-10-CM | POA: Diagnosis not present

## 2022-02-14 DIAGNOSIS — M9903 Segmental and somatic dysfunction of lumbar region: Secondary | ICD-10-CM | POA: Diagnosis not present

## 2022-02-15 DIAGNOSIS — M9903 Segmental and somatic dysfunction of lumbar region: Secondary | ICD-10-CM | POA: Diagnosis not present

## 2022-02-15 DIAGNOSIS — M5432 Sciatica, left side: Secondary | ICD-10-CM | POA: Diagnosis not present

## 2022-02-15 DIAGNOSIS — M9904 Segmental and somatic dysfunction of sacral region: Secondary | ICD-10-CM | POA: Diagnosis not present

## 2022-02-15 DIAGNOSIS — M9905 Segmental and somatic dysfunction of pelvic region: Secondary | ICD-10-CM | POA: Diagnosis not present

## 2022-02-19 DIAGNOSIS — M9905 Segmental and somatic dysfunction of pelvic region: Secondary | ICD-10-CM | POA: Diagnosis not present

## 2022-02-19 DIAGNOSIS — M9903 Segmental and somatic dysfunction of lumbar region: Secondary | ICD-10-CM | POA: Diagnosis not present

## 2022-02-19 DIAGNOSIS — M9904 Segmental and somatic dysfunction of sacral region: Secondary | ICD-10-CM | POA: Diagnosis not present

## 2022-02-19 DIAGNOSIS — M5432 Sciatica, left side: Secondary | ICD-10-CM | POA: Diagnosis not present

## 2022-02-21 DIAGNOSIS — M9905 Segmental and somatic dysfunction of pelvic region: Secondary | ICD-10-CM | POA: Diagnosis not present

## 2022-02-21 DIAGNOSIS — M9904 Segmental and somatic dysfunction of sacral region: Secondary | ICD-10-CM | POA: Diagnosis not present

## 2022-02-21 DIAGNOSIS — M5432 Sciatica, left side: Secondary | ICD-10-CM | POA: Diagnosis not present

## 2022-02-21 DIAGNOSIS — M9903 Segmental and somatic dysfunction of lumbar region: Secondary | ICD-10-CM | POA: Diagnosis not present

## 2022-02-22 DIAGNOSIS — M9903 Segmental and somatic dysfunction of lumbar region: Secondary | ICD-10-CM | POA: Diagnosis not present

## 2022-02-22 DIAGNOSIS — M9905 Segmental and somatic dysfunction of pelvic region: Secondary | ICD-10-CM | POA: Diagnosis not present

## 2022-02-22 DIAGNOSIS — M5432 Sciatica, left side: Secondary | ICD-10-CM | POA: Diagnosis not present

## 2022-02-22 DIAGNOSIS — M9904 Segmental and somatic dysfunction of sacral region: Secondary | ICD-10-CM | POA: Diagnosis not present

## 2022-02-26 DIAGNOSIS — M9903 Segmental and somatic dysfunction of lumbar region: Secondary | ICD-10-CM | POA: Diagnosis not present

## 2022-02-26 DIAGNOSIS — M9904 Segmental and somatic dysfunction of sacral region: Secondary | ICD-10-CM | POA: Diagnosis not present

## 2022-02-26 DIAGNOSIS — M5432 Sciatica, left side: Secondary | ICD-10-CM | POA: Diagnosis not present

## 2022-02-26 DIAGNOSIS — M9905 Segmental and somatic dysfunction of pelvic region: Secondary | ICD-10-CM | POA: Diagnosis not present

## 2022-02-28 DIAGNOSIS — M9904 Segmental and somatic dysfunction of sacral region: Secondary | ICD-10-CM | POA: Diagnosis not present

## 2022-02-28 DIAGNOSIS — M9905 Segmental and somatic dysfunction of pelvic region: Secondary | ICD-10-CM | POA: Diagnosis not present

## 2022-02-28 DIAGNOSIS — M5432 Sciatica, left side: Secondary | ICD-10-CM | POA: Diagnosis not present

## 2022-02-28 DIAGNOSIS — M9903 Segmental and somatic dysfunction of lumbar region: Secondary | ICD-10-CM | POA: Diagnosis not present

## 2022-03-01 ENCOUNTER — Encounter: Payer: Self-pay | Admitting: *Deleted

## 2022-03-01 DIAGNOSIS — M5432 Sciatica, left side: Secondary | ICD-10-CM | POA: Diagnosis not present

## 2022-03-01 DIAGNOSIS — M9904 Segmental and somatic dysfunction of sacral region: Secondary | ICD-10-CM | POA: Diagnosis not present

## 2022-03-01 DIAGNOSIS — M9903 Segmental and somatic dysfunction of lumbar region: Secondary | ICD-10-CM | POA: Diagnosis not present

## 2022-03-01 DIAGNOSIS — M9905 Segmental and somatic dysfunction of pelvic region: Secondary | ICD-10-CM | POA: Diagnosis not present

## 2022-03-06 ENCOUNTER — Ambulatory Visit (INDEPENDENT_AMBULATORY_CARE_PROVIDER_SITE_OTHER): Payer: Medicare Other | Admitting: Nurse Practitioner

## 2022-03-06 ENCOUNTER — Encounter: Payer: Self-pay | Admitting: Nurse Practitioner

## 2022-03-06 VITALS — BP 138/75 | HR 96 | Temp 97.1°F | Resp 20 | Ht 59.0 in | Wt 92.0 lb

## 2022-03-06 DIAGNOSIS — M5432 Sciatica, left side: Secondary | ICD-10-CM

## 2022-03-06 MED ORDER — PREDNISONE 20 MG PO TABS: 40.0000 mg | ORAL_TABLET | Freq: Every day | ORAL | 0 refills | Status: AC

## 2022-03-06 MED ORDER — METHYLPREDNISOLONE ACETATE 80 MG/ML IJ SUSP
80.0000 mg | Freq: Once | INTRAMUSCULAR | Status: AC
  Administered 2022-03-06: 80 mg via INTRAMUSCULAR

## 2022-03-06 MED ORDER — KETOROLAC TROMETHAMINE 60 MG/2ML IM SOLN
60.0000 mg | Freq: Once | INTRAMUSCULAR | Status: AC
  Administered 2022-03-06: 60 mg via INTRAMUSCULAR

## 2022-03-06 NOTE — Progress Notes (Signed)
? ?  Subjective:  ? ? Patient ID: Catherine Jensen, female    DOB: 1951-08-16, 71 y.o.   MRN: [id number] ? ? ?Chief Complaint: Left side sciatica pain ? ? ?HPI ?Patient come sin c/o left leg pain that goes from buttocks down to foot. We gave her toradol and prednisone which helped for a week. Then she went o see chiropractor for 11 visits and has gotten no better. She has been taking advil and tylenol. She rates pain 10/10. Standing and walking increases pain. Sitting relieves pain a little.  ? ? ? ?Review of Systems  ?Constitutional:  Negative for diaphoresis.  ?Eyes:  Negative for pain.  ?Respiratory:  Negative for shortness of breath.   ?Cardiovascular:  Negative for chest pain, palpitations and leg swelling.  ?Gastrointestinal:  Negative for abdominal pain.  ?Endocrine: Negative for polydipsia.  ?Skin:  Negative for rash.  ?Neurological:  Negative for dizziness, weakness and headaches.  ?Hematological:  Does not bruise/bleed easily.  ?All other systems reviewed and are negative. ? ?   ?Objective:  ? Physical Exam ?Vitals and nursing note reviewed.  ?Constitutional:   ?   Appearance: Normal appearance.  ?Cardiovascular:  ?   Rate and Rhythm: Normal rate and regular rhythm.  ?   Heart sounds: Normal heart sounds.  ?Pulmonary:  ?   Effort: Pulmonary effort is normal.  ?   Breath sounds: Normal breath sounds.  ?Musculoskeletal:  ?   Comments: (-) SLR bil  ?Skin: ?   General: Skin is warm.  ?Neurological:  ?   General: No focal deficit present.  ?   Mental Status: She is alert and oriented to person, place, and time.  ?Psychiatric:     ?   Mood and Affect: Mood normal.     ?   Behavior: Behavior normal.  ? ?BP 138/75   Pulse 96   Temp (!) 97.1 ?F (36.2 ?C) (Temporal)   Resp 20   Ht 4\' 11"  (1.499 m)   Wt 92 lb (41.7 kg)   LMP 02/24/1990 Comment: [facility] does pap,[name] CNM  BMI 18.58 kg/m?  ? ? ? ? ?   ?Assessment & Plan:  ?Catherine Jensen in today with chief complaint of Left side sciatica  pain ? ? ?1. Sciatica of left side ?Moist heat ?Rest ?stretches ?- predniSONE (DELTASONE) 20 MG tablet; Take 2 tablets (40 mg total) by mouth daily with breakfast for 5 days. 2 po daily for 5 days  Dispense: 10 tablet; Refill: 0 ?- ketorolac (TORADOL) injection 60 mg ?- methylPREDNISolone acetate (DEPO-MEDROL) injection 80 mg ?- Ambulatory referral to Physical Therapy ? ? ? ?The above assessment and management plan was discussed with the patient. The patient verbalized understanding of and has agreed to the management plan. Patient is aware to call the clinic if symptoms persist or worsen. Patient is aware when to return to the clinic for a follow-up visit. Patient educated on when it is appropriate to go to the emergency department.  ? ?[place]-Margaret Hassell Done, FNP ? ? ? ?

## 2022-03-06 NOTE — Patient Instructions (Signed)

## 2022-03-14 ENCOUNTER — Ambulatory Visit: Payer: Medicare Other | Attending: Nurse Practitioner

## 2022-03-14 DIAGNOSIS — M79662 Pain in left lower leg: Secondary | ICD-10-CM | POA: Diagnosis not present

## 2022-03-14 DIAGNOSIS — M25552 Pain in left hip: Secondary | ICD-10-CM | POA: Insufficient documentation

## 2022-03-14 DIAGNOSIS — M5432 Sciatica, left side: Secondary | ICD-10-CM | POA: Insufficient documentation

## 2022-03-14 NOTE — Therapy (Signed)
[place] ?[facility] ?[place] ?[place], [place], [address] ?Phone: [phone]   Fax:  [phone] ? ?Physical Therapy Evaluation ? ?Patient Details  ?Name: Catherine Jensen ?MRN: [id number] ?Date of Birth: 09-30-1951 ?Referring Provider (PT): Hassell Done, FNP ? ? ?Encounter Date: 03/14/2022 ? ? PT End of Session - 03/14/22 0813   ? ? Visit Number 1   ? Number of Visits 8   ? Date for PT Re-Evaluation 05/25/22   ? PT Start Time 0815   ? PT Stop Time 3428   ? PT Time Calculation (min) 49 min   ? Activity Tolerance Patient tolerated treatment well   ? Behavior During Therapy [facility] for tasks assessed/performed   ? ?  ?  ? ?  ? ? ?Past Medical History:  ?Diagnosis Date  ? COPD (chronic obstructive pulmonary disease) ([place])   ? Endometriosis   ? FH: lung cancer   ? H/O blood clots   ? in legs  ? History of COVID-19 08/25/2021  ? Multiple allergies   ? pt states she took allergy shots x 20 yrs  ? Tobacco abuse   ? ? ?Past Surgical History:  ?Procedure Laterality Date  ? ABLATION ON ENDOMETRIOSIS    ? SKIN LESION EXCISION    ? TOTAL ABDOMINAL HYSTERECTOMY  02/1990  ? TUBAL LIGATION    ? TUMOR REMOVAL    ? ? ?There were no vitals filed for this visit. ? ? ? Subjective Assessment - 03/14/22 0816   ? ? Subjective Patient reports that her leg started hurting last summer. She notes that it began getting worse. This was able to be reduced with an injection to her hips. However, it returned shortly afterward. She had tried going to a chiropractor eleven times.   ? Limitations Walking;Standing;Sitting   ? How long can you stand comfortably? pain with putting weight on the left   ? Patient Stated Goals play golf, complete her yardwork   ? Currently in Pain? Yes   ? Pain Score 10-Worst pain ever   ? Pain Location Hip   ? Pain Orientation Left   ? Pain Descriptors / Indicators Stabbing;Sore   ? Pain Type Chronic pain   ? Pain Radiating Towards left calf   ? Pain Onset More than a month ago   ? Pain  Frequency Constant   ? Aggravating Factors  sleeping on her left side, walking, standing, sitting   ? Pain Relieving Factors injection   ? Effect of Pain on Daily Activities must stop and take breaks   ? ?  ?  ? ?  ? ? ? ? ? [facility] PT Assessment - 03/14/22 0001   ? ?  ? Assessment  ? Medical Diagnosis Sciatica of left side   ? Referring Provider (PT) Hassell Done, [place]   ? Onset Date/Surgical Date --   last year  ? Next MD Visit Not scheduled   ? Prior Therapy No   ?  ? Precautions  ? Precautions None   ?  ? Restrictions  ? Weight Bearing Restrictions No   ?  ? Balance Screen  ? Has the patient fallen in the past 6 months No   ? Has the patient had a decrease in activity level because of a fear of falling?  No   ? Is the patient reluctant to leave their home because of a fear of falling?  No   ?  ? Home Environment  ? Living Environment Private residence   ?  Living Arrangements Spouse/significant other   ?  ? Prior Function  ? Level of Independence Independent   ? Leisure play golf   ?  ? Cognition  ? Overall Cognitive Status Within Functional Limits for tasks assessed   ? Attention Focused   ? Focused Attention Appears intact   ? Memory Appears intact   ? Awareness Appears intact   ? Problem Solving Appears intact   ?  ? Sensation  ? Additional Comments Patient reports no numbness or tingling   ?  ? ROM / Strength  ? AROM / PROM / Strength Strength;PROM   ?  ? PROM  ? Overall PROM  Within functional limits for tasks performed   ?  ? Strength  ? Strength Assessment Site Hip;Knee;Ankle   ? Right/Left Hip Right;Left   ? Right Hip Flexion 4-/5   ? Left Hip Flexion 4-/5   ? Right/Left Knee Right;Left   ? Right Knee Flexion 4/5   ? Right Knee Extension 5/5   ? Left Knee Flexion 4-/5   ? Left Knee Extension 5/5   ? Right/Left Ankle Right;Left   ? Right Ankle Dorsiflexion 4/5   ? Left Ankle Dorsiflexion 4/5   ?  ? Palpation  ? Palpation comment TTP: left piriformis   ?  ? Special Tests  ?  Special Tests Lumbar   ? Lumbar Tests  Straight Leg Raise   ?  ? Straight Leg Raise  ? Findings Negative   ?  ? Transfers  ? Transfers Sit to Stand;Stand to Sit   ? Sit to Stand 6: Modified independent (Device/Increase time);With upper extremity assist;With armrests   ? Stand to Sit 6: Modified independent (Device/Increase time);With upper extremity assist;With armrests   ? ?  ?  ? ?  ? ? ? ? ? ? ? ? ? ? ? ? ? ?Objective measurements completed on examination: See above findings.  ? ? ? ? ? [place] Adult PT Treatment/Exercise - 03/14/22 0001   ? ?  ? Exercises  ? Exercises Knee/Hip   ?  ? Knee/Hip Exercises: Stretches  ? Piriformis Stretch Left;3 reps;20 seconds   ?  ? Knee/Hip Exercises: Standing  ? Forward Lunges Left;10 reps   on 6" step  ?  ? Knee/Hip Exercises: Supine  ? Other Supine Knee/Hip Exercises Glute set   5 seconds; 15 reps  ? ?  ?  ? ?  ? ? ? ? ? ? ? ? ? ? ? ? ? ? ? PT Long Term Goals - 03/14/22 1109   ? ?  ? PT LONG TERM GOAL #1  ? Title Patient will be independent with her HEP.   ? Time 4   ? Period Weeks   ? Status New   ? Target Date 04/11/22   ?  ? PT LONG TERM GOAL #2  ? Title Patient will be able to return to playing golf without being limited by her familiar left hip or referred LE pain.   ? Time 4   ? Period Weeks   ? Status New   ? Target Date 04/11/22   ?  ? PT LONG TERM GOAL #3  ? Title Patient will be able to navigate at least 4 step without being limited by her familiar left hip and lower extremity pain.   ? Time 4   ? Period Weeks   ? Status New   ? Target Date 04/11/22   ?  ? PT LONG TERM GOAL #  4  ? Title Patient will be able to transfer from sitting to standing without UE support for improved ability transfer.   ? Time 4   ? Period Weeks   ? Status New   ? Target Date 04/11/22   ? ?  ?  ? ?  ? ? ? ? ? ? ? ? ? Plan - 03/14/22 0813   ? ? Clinical Impression Statement Patient is a 71 year old female presenting to physical therapy with chronic left hip and lower leg pain. She presented with moderate to high pain severity and  irritability with weight bearing through her LLE being the most painful. Her familiar left hip pain was able to be reproduced with palpation to her left hamstring and piriformis. However, her lower leg pain was unable to be reproduced with any of today's assessments. Recommend that she continue with skilled physical therapy to address her remaining impairments to return to her prior level of function.   ? Personal Factors and Comorbidities Comorbidity 2;Time since onset of injury/illness/exacerbation   ? Comorbidities COPD, history of DVT   ? Examination-Activity Limitations Locomotion Level;Transfers;Carry;Squat;Stairs;Stand   ? Examination-Participation Restrictions Meal Prep;Cleaning;Community Activity;Shop;Yard Work   ? Stability/Clinical Decision Making Evolving/Moderate complexity   ? Clinical Decision Making Moderate   ? Rehab Potential Fair   ? PT Frequency 2x / week   ? PT Duration 4 weeks   ? PT Treatment/Interventions ADLs/Self Care Home Management;Cryotherapy;Electrical Stimulation;Moist Heat;Neuromuscular re-education;Therapeutic exercise;Therapeutic activities;Functional mobility training;Stair training;Patient/family education;Manual techniques;Dry needling;Passive range of motion   ? PT Next Visit Plan MT to left hip, LE strengthening, and modalities as needed   ? Consulted and Agree with Plan of Care Patient   ? ?  ?  ? ?  ? ? ?Patient will benefit from skilled therapeutic intervention in order to improve the following deficits and impairments:  Difficulty walking, Decreased activity tolerance, Pain, Decreased strength ? ?Visit Diagnosis: ?Pain in left hip ? ?Pain in left lower leg ? ? ? ? ?Problem List ?Patient Active Problem List  ? Diagnosis Date Noted  ? Hypercalcemia 02/10/2021  ? Osteopenia 12/26/2020  ? Personal history of DVT (deep vein thrombosis) 04/08/2014  ?  Class: History of  ? COPD (chronic obstructive pulmonary disease) ([place]) 05/22/2011  ? ? ?Darlin Coco, PT ?03/14/2022, 12:05  PM ? ?[place] ?[facility] ?[address] ?[place], [place], [address] ?Phone: [phone]   Fax:  [phone] ? ?Name: Catherine Jensen ?MRN: [id number] ?Date of Birth: [birth year] ?

## 2022-03-15 ENCOUNTER — Ambulatory Visit: Payer: Medicare Other

## 2022-03-15 DIAGNOSIS — M25552 Pain in left hip: Secondary | ICD-10-CM

## 2022-03-15 DIAGNOSIS — M5432 Sciatica, left side: Secondary | ICD-10-CM | POA: Diagnosis not present

## 2022-03-15 DIAGNOSIS — M79662 Pain in left lower leg: Secondary | ICD-10-CM

## 2022-03-15 NOTE — Therapy (Signed)
[facility] ?[facility] ?[place] ?[place], [place], [address] ?Phone: [phone]   Fax:  [phone] ? ?Physical Therapy Treatment ? ?Patient Details  ?Name: Catherine Jensen ?MRN: [id number] ?Date of Birth: 1951/08/22 ?Referring Provider (PT): Hassell Done, FNP ? ? ?Encounter Date: 03/15/2022 ? ? PT End of Session - 03/15/22 0817   ? ? Visit Number 2   ? Number of Visits 8   ? Date for PT Re-Evaluation 05/25/22   ? PT Start Time 0815   ? PT Stop Time 0902   ? PT Time Calculation (min) 47 min   ? Activity Tolerance Patient tolerated treatment well   ? Behavior During Therapy [facility] for tasks assessed/performed   ? ?  ?  ? ?  ? ? ?Past Medical History:  ?Diagnosis Date  ? COPD (chronic obstructive pulmonary disease) ([place])   ? Endometriosis   ? FH: lung cancer   ? H/O blood clots   ? in legs  ? History of COVID-19 08/25/2021  ? Multiple allergies   ? pt states she took allergy shots x 20 yrs  ? Tobacco abuse   ? ? ?Past Surgical History:  ?Procedure Laterality Date  ? ABLATION ON ENDOMETRIOSIS    ? SKIN LESION EXCISION    ? TOTAL ABDOMINAL HYSTERECTOMY  02/1990  ? TUBAL LIGATION    ? TUMOR REMOVAL    ? ? ?There were no vitals filed for this visit. ? ? Subjective Assessment - 03/15/22 0816   ? ? Subjective Patient reports that her hip is really hurting today. She notes that it feels like her hip is back to where it was prior to her shot.   ? Limitations Walking;Standing;Sitting   ? How long can you stand comfortably? pain with putting weight on the left   ? Patient Stated Goals play golf, complete her yardwork   ? Currently in Pain? Yes   ? Pain Score 9    ? Pain Location Hip   ? Pain Orientation Left   ? Pain Onset More than a month ago   ? ?  ?  ? ?  ? ? ? ? ? ? ? ? ? ? ? ? ? ? ? ? ? ? ? ? [place] Adult PT Treatment/Exercise - 03/15/22 0001   ? ?  ? Knee/Hip Exercises: Seated  ? Knee/Hip Flexion Knee flexion   green t-band; 20 reps; LLE  ? Other Seated Knee/Hip Exercises Glute set   5 second  hold; 30 reps  ?  ? Knee/Hip Exercises: Supine  ? Hip Adduction Isometric Both;20 reps   5 second hold  ? Straight Leg Raises 20 reps;Left   ? Other Supine Knee/Hip Exercises Hip ABD isometric   20 reps; 5 second hold  ?  ? Knee/Hip Exercises: Sidelying  ? Clams R sidelying; 20 reps   ?  ? Manual Therapy  ? Manual Therapy Soft tissue mobilization   ? Soft tissue mobilization left piriformis and surrounding musculature   ? ?  ?  ? ?  ? ? ? ? ? ? ? ? ? ? ? ? ? ? ? PT Long Term Goals - 03/14/22 1109   ? ?  ? PT LONG TERM GOAL #1  ? Title Patient will be independent with her HEP.   ? Time 4   ? Period Weeks   ? Status New   ? Target Date 04/11/22   ?  ? PT LONG TERM GOAL #2  ? Title Patient  will be able to return to playing golf without being limited by her familiar left hip or referred LE pain.   ? Time 4   ? Period Weeks   ? Status New   ? Target Date 04/11/22   ?  ? PT LONG TERM GOAL #3  ? Title Patient will be able to navigate at least 4 step without being limited by her familiar left hip and lower extremity pain.   ? Time 4   ? Period Weeks   ? Status New   ? Target Date 04/11/22   ?  ? PT LONG TERM GOAL #4  ? Title Patient will be able to transfer from sitting to standing without UE support for improved ability transfer.   ? Time 4   ? Period Weeks   ? Status New   ? Target Date 04/11/22   ? ?  ?  ? ?  ? ? ? ? ? ? ? ? Plan - 03/15/22 1102   ? ? Clinical Impression Statement Patient was introduced to multiple new interventions for pain free muscular engagement. She reported no significant increase in her familiar pain or discomfort with any of today's interventions. Manual therapy focused on soft tissue mobilization to the piriformis as this was able to slightly reduce her familiar left hip pain. She reported that if felt better standing and walking upon the conclusion of treatment. She continues to require skilled physical therapy to address her remaining impairments to return to her prior level of function.   ?  Personal Factors and Comorbidities Comorbidity 2;Time since onset of injury/illness/exacerbation   ? Comorbidities COPD, history of DVT   ? Examination-Activity Limitations Locomotion Level;Transfers;Carry;Squat;Stairs;Stand   ? Examination-Participation Restrictions Meal Prep;Cleaning;Community Activity;Shop;Yard Work   ? Stability/Clinical Decision Making Evolving/Moderate complexity   ? Rehab Potential Fair   ? PT Frequency 2x / week   ? PT Duration 4 weeks   ? PT Treatment/Interventions ADLs/Self Care Home Management;Cryotherapy;Electrical Stimulation;Moist Heat;Neuromuscular re-education;Therapeutic exercise;Therapeutic activities;Functional mobility training;Stair training;Patient/family education;Manual techniques;Dry needling;Passive range of motion   ? PT Next Visit Plan MT to left hip, LE strengthening, and modalities as needed   ? Consulted and Agree with Plan of Care Patient   ? ?  ?  ? ?  ? ? ?Patient will benefit from skilled therapeutic intervention in order to improve the following deficits and impairments:  Difficulty walking, Decreased activity tolerance, Pain, Decreased strength ? ?Visit Diagnosis: ?Pain in left hip ? ?Pain in left lower leg ? ? ? ? ?Problem List ?Patient Active Problem List  ? Diagnosis Date Noted  ? Hypercalcemia 02/10/2021  ? Osteopenia 12/26/2020  ? Personal history of DVT (deep vein thrombosis) 04/08/2014  ?  Class: History of  ? COPD (chronic obstructive pulmonary disease) ([place]) 05/22/2011  ? ? ?Darlin Coco, PT ?03/15/2022, 12:48 PM ? ?[place] ?[facility] ?[place] ?[place], [place], [address] ?Phone: [phone]   Fax:  [phone] ? ?Name: Catherine Jensen ?MRN: [id number] ?Date of Birth: July 14, 1951 ? ? ? ?

## 2022-03-19 ENCOUNTER — Ambulatory Visit: Payer: Medicare Other

## 2022-03-19 DIAGNOSIS — M25552 Pain in left hip: Secondary | ICD-10-CM

## 2022-03-19 DIAGNOSIS — M79662 Pain in left lower leg: Secondary | ICD-10-CM | POA: Diagnosis not present

## 2022-03-19 DIAGNOSIS — M5432 Sciatica, left side: Secondary | ICD-10-CM | POA: Diagnosis not present

## 2022-03-19 NOTE — Therapy (Signed)
[place] ?[facility] ?[address] ?[place], [place], [address] ?Phone: [phone]   Fax:  [phone] ? ?Physical Therapy Treatment ? ?Patient Details  ?Name: Catherine Jensen ?MRN: [id number] ?Date of Birth: 1951/02/27 ?Referring Provider (PT): Hassell Done, FNP ? ? ?Encounter Date: 03/19/2022 ? ? PT End of Session - 03/19/22 0820   ? ? Visit Number 3   ? Number of Visits 8   ? Date for PT Re-Evaluation 05/25/22   ? PT Start Time 0815   ? PT Stop Time 0901   ? PT Time Calculation (min) 46 min   ? Activity Tolerance Patient tolerated treatment well   ? Behavior During Therapy [facility] for tasks assessed/performed   ? ?  ?  ? ?  ? ? ?Past Medical History:  ?Diagnosis Date  ? COPD (chronic obstructive pulmonary disease) ([place])   ? Endometriosis   ? FH: lung cancer   ? H/O blood clots   ? in legs  ? History of COVID-19 08/25/2021  ? Multiple allergies   ? pt states she took allergy shots x 20 yrs  ? Tobacco abuse   ? ? ?Past Surgical History:  ?Procedure Laterality Date  ? ABLATION ON ENDOMETRIOSIS    ? SKIN LESION EXCISION    ? TOTAL ABDOMINAL HYSTERECTOMY  02/1990  ? TUBAL LIGATION    ? TUMOR REMOVAL    ? ? ?There were no vitals filed for this visit. ? ? Subjective Assessment - 03/19/22 0816   ? ? Subjective Patient reports that her hip is hurting today. She notes that she tried playing golf which really hurt on Friday.   ? Limitations Walking;Standing;Sitting   ? How long can you stand comfortably? pain with putting weight on the left   ? Patient Stated Goals play golf, complete her yardwork   ? Currently in Pain? Yes   ? Pain Score 6    ? Pain Location Hip   ? Pain Orientation Left   ? Pain Onset More than a month ago   ? ?  ?  ? ?  ? ? ? ? ? ? ? ? ? ? ? ? ? ? ? ? ? ? ? ? [place] Adult PT Treatment/Exercise - 03/19/22 0001   ? ?  ? Knee/Hip Exercises: Stretches  ? Other Knee/Hip Stretches Single knee to chest   5 second hold; 12 reps  ?  ? Knee/Hip Exercises: Standing  ? Heel Raises Left;15  reps   ? Heel Raises Limitations Toe raises   20 reps; BLE  ? Hip Flexion Left;Knee straight   limited due to pain  ? Hip Abduction Left;Knee straight   limited due to pain  ? Hip Extension Left;Knee straight   limited due to pain  ?  ? Knee/Hip Exercises: Supine  ? Hip Adduction Isometric Both;20 reps   5 second hold  ? Other Supine Knee/Hip Exercises Hamstring setting   5 second hold  ?  ? Knee/Hip Exercises: Sidelying  ? Hip ABduction Left;20 reps   ? Clams R sidelying; 20 reps   ?  ? Manual Therapy  ? Manual Therapy Soft tissue mobilization   ? Soft tissue mobilization left piriformis and surrounding musculature; left gastroc/soleus   ? ?  ?  ? ?  ? ? ? ? ? ? ? ? ? ? ? ? ? ? ? PT Long Term Goals - 03/14/22 1109   ? ?  ? PT LONG TERM GOAL #1  ? Title Patient  will be independent with her HEP.   ? Time 4   ? Period Weeks   ? Status New   ? Target Date 04/11/22   ?  ? PT LONG TERM GOAL #2  ? Title Patient will be able to return to playing golf without being limited by her familiar left hip or referred LE pain.   ? Time 4   ? Period Weeks   ? Status New   ? Target Date 04/11/22   ?  ? PT LONG TERM GOAL #3  ? Title Patient will be able to navigate at least 4 step without being limited by her familiar left hip and lower extremity pain.   ? Time 4   ? Period Weeks   ? Status New   ? Target Date 04/11/22   ?  ? PT LONG TERM GOAL #4  ? Title Patient will be able to transfer from sitting to standing without UE support for improved ability transfer.   ? Time 4   ? Period Weeks   ? Status New   ? Target Date 04/11/22   ? ?  ?  ? ?  ? ? ? ? ? ? ? ? Plan - 03/19/22 0820   ? ? Clinical Impression Statement Patient was attempted to be introduced to multiple new interventions for improved hip strength. However, her familiar pain limited her ability to complete standing interventions. She required minimal cuing throughout treatment for proper exercise perfomance to avoid aggravating her familiar symptoms. Manual therapy focused on  soft tissue mobilization to the left hip and gastroc/soleus which was able to moderately reduce her familiar symptoms. She reported that she felt a little better upon the conclusion of treatment. She continues to require skilled physical therapy to address her remaining impairments to maximize her functional mobility.   ? Personal Factors and Comorbidities Comorbidity 2;Time since onset of injury/illness/exacerbation   ? Comorbidities COPD, history of DVT   ? Examination-Activity Limitations Locomotion Level;Transfers;Carry;Squat;Stairs;Stand   ? Examination-Participation Restrictions Meal Prep;Cleaning;Community Activity;Shop;Yard Work   ? Stability/Clinical Decision Making Evolving/Moderate complexity   ? Rehab Potential Fair   ? PT Frequency 2x / week   ? PT Duration 4 weeks   ? PT Treatment/Interventions ADLs/Self Care Home Management;Cryotherapy;Electrical Stimulation;Moist Heat;Neuromuscular re-education;Therapeutic exercise;Therapeutic activities;Functional mobility training;Stair training;Patient/family education;Manual techniques;Dry needling;Passive range of motion   ? PT Next Visit Plan MT to left hip, LE strengthening, and modalities as needed   ? Consulted and Agree with Plan of Care Patient   ? ?  ?  ? ?  ? ? ?Patient will benefit from skilled therapeutic intervention in order to improve the following deficits and impairments:  Difficulty walking, Decreased activity tolerance, Pain, Decreased strength ? ?Visit Diagnosis: ?Pain in left hip ? ?Pain in left lower leg ? ? ? ? ?Problem List ?Patient Active Problem List  ? Diagnosis Date Noted  ? Hypercalcemia 02/10/2021  ? Osteopenia 12/26/2020  ? Personal history of DVT (deep vein thrombosis) 04/08/2014  ?  Class: History of  ? COPD (chronic obstructive pulmonary disease) ([place]) 05/22/2011  ? ? ?Darlin Coco, PT ?03/19/2022, 12:34 PM ? ?[place] ?[facility] ?[address] ?[place], [place], [address] ?Phone: [phone]    Fax:  [phone] ? ?Name: Jettie Mannor ?MRN: [id number] ?Date of Birth: 12/05/1950 ? ? ? ?

## 2022-03-21 ENCOUNTER — Telehealth: Payer: Self-pay | Admitting: Family

## 2022-03-21 DIAGNOSIS — M5432 Sciatica, left side: Secondary | ICD-10-CM

## 2022-03-21 DIAGNOSIS — Z85828 Personal history of other malignant neoplasm of skin: Secondary | ICD-10-CM | POA: Diagnosis not present

## 2022-03-21 DIAGNOSIS — L570 Actinic keratosis: Secondary | ICD-10-CM | POA: Diagnosis not present

## 2022-03-21 NOTE — Telephone Encounter (Signed)
REFERRAL REQUEST ?Telephone Note ? ?Have you been seen at our office for this problem? Yes ?(Advise that they may need an appointment with their PCP before a referral can be done) ? ?Reason for Referral: Sciatic nerve on left side ?Referral discussed with patient: NO  ?Best contact number of patient for referral team: [phone]    ?Has patient been seen by a specialist for this issue before: no  ?Patient provider preference for referral: Dr. Larena Glassman ?Patient location preference for referral: [place] office ?  ?Patient notified that referrals can take up to a week or longer to process. If they haven't heard anything within a week they should call back and speak with the referral department.   ?

## 2022-03-22 ENCOUNTER — Ambulatory Visit: Payer: Medicare Other

## 2022-03-22 DIAGNOSIS — M79662 Pain in left lower leg: Secondary | ICD-10-CM | POA: Diagnosis not present

## 2022-03-22 DIAGNOSIS — M5432 Sciatica, left side: Secondary | ICD-10-CM | POA: Diagnosis not present

## 2022-03-22 DIAGNOSIS — M25552 Pain in left hip: Secondary | ICD-10-CM

## 2022-03-22 NOTE — Therapy (Signed)
[place] ?[facility] ?[address] ?[place], [place], [address] ?Phone: [phone]   Fax:  [phone] ? ?Physical Therapy Treatment ? ?Patient Details  ?Name: Catherine Jensen ?MRN: [id number] ?Date of Birth: 1950/12/09 ?Referring Provider (PT): Hassell Done, FNP ? ? ?Encounter Date: 03/22/2022 ? ? PT End of Session - 03/22/22 7654   ? ? Visit Number 4   ? Number of Visits 8   ? Date for PT Re-Evaluation 05/25/22   ? PT Start Time 0815   ? PT Stop Time 0907   ? PT Time Calculation (min) 52 min   ? Activity Tolerance Patient tolerated treatment well   ? Behavior During Therapy [facility] for tasks assessed/performed   ? ?  ?  ? ?  ? ? ?Past Medical History:  ?Diagnosis Date  ? COPD (chronic obstructive pulmonary disease) ([place])   ? Endometriosis   ? FH: lung cancer   ? H/O blood clots   ? in legs  ? History of COVID-19 08/25/2021  ? Multiple allergies   ? pt states she took allergy shots x 20 yrs  ? Tobacco abuse   ? ? ?Past Surgical History:  ?Procedure Laterality Date  ? ABLATION ON ENDOMETRIOSIS    ? SKIN LESION EXCISION    ? TOTAL ABDOMINAL HYSTERECTOMY  02/1990  ? TUBAL LIGATION    ? TUMOR REMOVAL    ? ? ?There were no vitals filed for this visit. ? ? Subjective Assessment - 03/22/22 0816   ? ? Subjective Patient reports that hip was really painful yesterday. She notes that she mowed her yard on 4/25 and then she woke up the next morning with her hip hurting. She notes that it is a little better today. She notes that she felt good for about 2-3 hours after her last appointment.   ? Limitations Walking;Standing;Sitting   ? How long can you stand comfortably? pain with putting weight on the left   ? Patient Stated Goals play golf, complete her yardwork   ? Currently in Pain? Yes   ? Pain Score 9    ? Pain Location Hip   ? Pain Onset More than a month ago   ? ?  ?  ? ?  ? ? ? ? ? ? ? ? ? ? ? ? ? ? ? ? ? ? ? ? [place] Adult PT Treatment/Exercise - 03/22/22 0001   ? ?  ? Knee/Hip Exercises:  Stretches  ? Other Knee/Hip Stretches Lower trunk rotation   2 minutes  ?  ? Knee/Hip Exercises: Standing  ? Rocker Board 2 minutes   lower leg discomfort  ?  ? Knee/Hip Exercises: Seated  ? Long [employer] Left;20 reps   alternating with knee flexion  ?  ? Knee/Hip Exercises: Supine  ? Bridges Both;20 reps   ? Straight Leg Raises Right;Left;20 reps   ? Other Supine Knee/Hip Exercises Hamstring setting   20 reps; 5 second hold  ?  ? Modalities  ? Modalities Electrical Stimulation   ?  ? Electrical Stimulation  ? Electrical Stimulation Location left fibularis   no redness or adverse reaction to this modality  ? Electrical Stimulation Action pre mod   ? Electrical Stimulation Parameters 80-150 Hz x 10 minutes   ? Electrical Stimulation Goals Pain   ?  ? Manual Therapy  ? Manual Therapy Soft tissue mobilization   ? Soft tissue mobilization left piriformis and surrounding musculature; left gastroc/soleus   ? ?  ?  ? ?  ? ? ? ? ? ? ? ? ? ? ? ? ? ? ?  PT Long Term Goals - 03/14/22 1109   ? ?  ? PT LONG TERM GOAL #1  ? Title Patient will be independent with her HEP.   ? Time 4   ? Period Weeks   ? Status New   ? Target Date 04/11/22   ?  ? PT LONG TERM GOAL #2  ? Title Patient will be able to return to playing golf without being limited by her familiar left hip or referred LE pain.   ? Time 4   ? Period Weeks   ? Status New   ? Target Date 04/11/22   ?  ? PT LONG TERM GOAL #3  ? Title Patient will be able to navigate at least 4 step without being limited by her familiar left hip and lower extremity pain.   ? Time 4   ? Period Weeks   ? Status New   ? Target Date 04/11/22   ?  ? PT LONG TERM GOAL #4  ? Title Patient will be able to transfer from sitting to standing without UE support for improved ability transfer.   ? Time 4   ? Period Weeks   ? Status New   ? Target Date 04/11/22   ? ?  ?  ? ?  ? ? ? ? ? ? ? ? Plan - 03/22/22 0821   ? ? Clinical Impression Statement Patient focused on pain free supine interventions with  minimal relief to her familiar symptoms. She required cueing with these interventions to maintain this pain free ROM. This limited her ability to complete activities such as the rocker board. Manual therapy was able to slightly reduce her familiar symptoms. She was educated on healing and nerve referral patterns throughout treatment and how it relates to her current condition. She reported that she felt a little better upon the conclusion of treatment. She continues to require skilled physical therapy to address her remaining impairments to maximize her functional mobiltiy.   ? Personal Factors and Comorbidities Comorbidity 2;Time since onset of injury/illness/exacerbation   ? Comorbidities COPD, history of DVT   ? Examination-Activity Limitations Locomotion Level;Transfers;Carry;Squat;Stairs;Stand   ? Examination-Participation Restrictions Meal Prep;Cleaning;Community Activity;Shop;Yard Work   ? Stability/Clinical Decision Making Evolving/Moderate complexity   ? Rehab Potential Fair   ? PT Frequency 2x / week   ? PT Duration 4 weeks   ? PT Treatment/Interventions ADLs/Self Care Home Management;Cryotherapy;Electrical Stimulation;Moist Heat;Neuromuscular re-education;Therapeutic exercise;Therapeutic activities;Functional mobility training;Stair training;Patient/family education;Manual techniques;Dry needling;Passive range of motion   ? PT Next Visit Plan MT to left hip, LE strengthening, and modalities as needed   ? Consulted and Agree with Plan of Care Patient   ? ?  ?  ? ?  ? ? ?Patient will benefit from skilled therapeutic intervention in order to improve the following deficits and impairments:  Difficulty walking, Decreased activity tolerance, Pain, Decreased strength ? ?Visit Diagnosis: ?Pain in left hip ? ?Pain in left lower leg ? ? ? ? ?Problem List ?Patient Active Problem List  ? Diagnosis Date Noted  ? Hypercalcemia 02/10/2021  ? Osteopenia 12/26/2020  ? Personal history of DVT (deep vein thrombosis)  04/08/2014  ?  Class: History of  ? COPD (chronic obstructive pulmonary disease) ([place]) 05/22/2011  ? ? ?Darlin Coco, PT ?03/22/2022, 9:43 AM ? ?[place] ?[facility] ?[address] ?[place], [place], [address] ?Phone: [phone]   Fax:  [phone] ? ?Name: Vivianna Piccini ?MRN: [id number] ?Date of Birth: March 02, 1951 ? ? ? ?

## 2022-03-23 NOTE — Telephone Encounter (Signed)
Referral placed.  ? ?[name], FNP ? ?

## 2022-03-26 ENCOUNTER — Ambulatory Visit: Payer: Medicare Other | Attending: Nurse Practitioner

## 2022-03-26 DIAGNOSIS — M79662 Pain in left lower leg: Secondary | ICD-10-CM | POA: Insufficient documentation

## 2022-03-26 DIAGNOSIS — M25552 Pain in left hip: Secondary | ICD-10-CM | POA: Diagnosis not present

## 2022-03-26 NOTE — Therapy (Signed)
[facility] ?[address] ?[place], [place], [address] ?Phone: [phone]   Fax:  [phone] ? ?Physical Therapy Treatment ? ?Patient Details  ?Name: Catherine Jensen ?MRN: [id number] ?Date of Birth: Dec 30, 1950 ?Referring Provider (PT): Hassell Done, FNP ? ? ?Encounter Date: 03/26/2022 ? ? PT End of Session - 03/26/22 0817   ? ? Visit Number 5   ? Number of Visits 8   ? Date for PT Re-Evaluation 05/25/22   ? PT Start Time 0815   ? PT Stop Time 0901   ? PT Time Calculation (min) 46 min   ? Activity Tolerance Patient tolerated treatment well   ? Behavior During Therapy [facility] for tasks assessed/performed   ? ?  ?  ? ?  ? ? ?Past Medical History:  ?Diagnosis Date  ? COPD (chronic obstructive pulmonary disease) ([place])   ? Endometriosis   ? FH: lung cancer   ? H/O blood clots   ? in legs  ? History of COVID-19 08/25/2021  ? Multiple allergies   ? pt states she took allergy shots x 20 yrs  ? Tobacco abuse   ? ? ?Past Surgical History:  ?Procedure Laterality Date  ? ABLATION ON ENDOMETRIOSIS    ? SKIN LESION EXCISION    ? TOTAL ABDOMINAL HYSTERECTOMY  02/1990  ? TUBAL LIGATION    ? TUMOR REMOVAL    ? ? ?There were no vitals filed for this visit. ? ? Subjective Assessment - 03/26/22 0815   ? ? Subjective Patient reports that her hip and leg still feel terrible. She feels about the same since she first started therapy.   ? Limitations Walking;Standing;Sitting   ? How long can you stand comfortably? pain with putting weight on the left   ? Patient Stated Goals play golf, complete her yardwork   ? Currently in Pain? Yes   ? Pain Score 9    ? Pain Location Hip   ? Pain Orientation Left   ? Pain Onset More than a month ago   ? ?  ?  ? ?  ? ? ? ? ? ? ? ? ? ? ? ? ? ? ? ? ? ? ? ? [facility] Adult PT Treatment/Exercise - 03/26/22 0001   ? ?  ? Knee/Hip Exercises: Aerobic  ? Nustep L3 x 6 minutes   limited by fatigue  ?  ? Knee/Hip Exercises: Standing  ? Heel Raises Limitations Toe raises   20 reps  ?  Forward Lunges Both;20 reps;5 seconds   onto 6" step  ? Other Standing Knee Exercises Side stepping   10 laps  ?  ? Knee/Hip Exercises: Seated  ? Long [employer] reps   3 second hold  ?  ? Electrical Stimulation  ? Electrical Stimulation Location left fibularis and lateral hip   ? Electrical Stimulation Action IFC   ? Electrical Stimulation Parameters 80-150 Hz w/ 40% scan x 12 minutes   ? Electrical Stimulation Goals Pain   ? ?  ?  ? ?  ? ? ? ? ? ? ? ? ? ? ? ? ? ? ? PT Long Term Goals - 03/14/22 1109   ? ?  ? PT LONG TERM GOAL #1  ? Title Patient will be independent with her HEP.   ? Time 4   ? Period Weeks   ? Status New   ? Target Date 04/11/22   ?  ? PT LONG TERM GOAL #2  ? Title Patient  will be able to return to playing golf without being limited by her familiar left hip or referred LE pain.   ? Time 4   ? Period Weeks   ? Status New   ? Target Date 04/11/22   ?  ? PT LONG TERM GOAL #3  ? Title Patient will be able to navigate at least 4 step without being limited by her familiar left hip and lower extremity pain.   ? Time 4   ? Period Weeks   ? Status New   ? Target Date 04/11/22   ?  ? PT LONG TERM GOAL #4  ? Title Patient will be able to transfer from sitting to standing without UE support for improved ability transfer.   ? Time 4   ? Period Weeks   ? Status New   ? Target Date 04/11/22   ? ?  ?  ? ?  ? ? ? ? ? ? ? ? Plan - 03/26/22 0817   ? ? Clinical Impression Statement Patient was introduced to multiple new interventions for light hip strengthening and mobility. However, she required multiple seated rest breaks throughout treatment due to increased left hip and lower extremity soreness and discomfort. Electrical stimulation and the Nustep were the most effective at reducing her familiar pain. She reported feeling better upon the conclusion of treatment. She continues to require skilled physical therapy to address her remaining impairments to maximize her functional mobility.   ? Personal Factors  and Comorbidities Comorbidity 2;Time since onset of injury/illness/exacerbation   ? Comorbidities COPD, history of DVT   ? Examination-Activity Limitations Locomotion Level;Transfers;Carry;Squat;Stairs;Stand   ? Examination-Participation Restrictions Meal Prep;Cleaning;Community Activity;Shop;Yard Work   ? Stability/Clinical Decision Making Evolving/Moderate complexity   ? Rehab Potential Fair   ? PT Frequency 2x / week   ? PT Duration 4 weeks   ? PT Treatment/Interventions ADLs/Self Care Home Management;Cryotherapy;Electrical Stimulation;Moist Heat;Neuromuscular re-education;Therapeutic exercise;Therapeutic activities;Functional mobility training;Stair training;Patient/family education;Manual techniques;Dry needling;Passive range of motion   ? PT Next Visit Plan MT to left hip, LE strengthening, and modalities as needed   ? Consulted and Agree with Plan of Care Patient   ? ?  ?  ? ?  ? ? ?Patient will benefit from skilled therapeutic intervention in order to improve the following deficits and impairments:  Difficulty walking, Decreased activity tolerance, Pain, Decreased strength ? ?Visit Diagnosis: ?Pain in left hip ? ?Pain in left lower leg ? ? ? ? ?Problem List ?Patient Active Problem List  ? Diagnosis Date Noted  ? Hypercalcemia 02/10/2021  ? Osteopenia 12/26/2020  ? Personal history of DVT (deep vein thrombosis) 04/08/2014  ?  Class: History of  ? COPD (chronic obstructive pulmonary disease) ([place]) 05/22/2011  ? ? ?Darlin Coco, PT ?03/26/2022, 9:18 AM ? ?[facility] ?[place] ?[place], [place], [address] ?Phone: [phone]   Fax:  [phone] ? ?Name: Catherine Jensen ?MRN: [id number] ?Date of Birth: [birth year] ? ? ? ?

## 2022-03-28 ENCOUNTER — Ambulatory Visit: Payer: Medicare Other

## 2022-03-28 DIAGNOSIS — M79662 Pain in left lower leg: Secondary | ICD-10-CM

## 2022-03-28 DIAGNOSIS — M25552 Pain in left hip: Secondary | ICD-10-CM | POA: Diagnosis not present

## 2022-03-28 NOTE — Therapy (Addendum)
[facility] [place], [place], [address] Phone: [phone]   Fax:  [phone]  Physical Therapy Treatment  Patient Details  Name: Catherine Jensen MRN: [id number] Date of Birth: 04-21-1951 Referring Provider (PT): Hassell Done, Shelley   Encounter Date: 03/28/2022   PT End of Session - 03/28/22 0834     Visit Number 6    Number of Visits 8    Date for PT Re-Evaluation 05/25/22    PT Start Time 0815    PT Stop Time 0900    PT Time Calculation (min) 45 min    Activity Tolerance Patient tolerated treatment well    Behavior During Therapy [facility] for tasks assessed/performed             Past Medical History:  Diagnosis Date   COPD (chronic obstructive pulmonary disease) ([place])    Endometriosis    FH: lung cancer    H/O blood clots    in legs   History of COVID-19 08/25/2021   Multiple allergies    pt states she took allergy shots x 20 yrs   Tobacco abuse     Past Surgical History:  Procedure Laterality Date   ABLATION ON ENDOMETRIOSIS     SKIN LESION EXCISION     TOTAL ABDOMINAL HYSTERECTOMY  02/1990   TUBAL LIGATION     TUMOR REMOVAL      There were no vitals filed for this visit.   Subjective Assessment - 03/28/22 0816     Subjective Patient reports that she felt better after her last appointment. However, she decided to play golf yesterday, but that really bothered her hip. She notes that she was not able to sleep at all last night.    Limitations Walking;Standing;Sitting    How long can you stand comfortably? pain with putting weight on the left    Patient Stated Goals play golf, complete her yardwork    Currently in Pain? Yes    Pain Score 9     Pain Location Hip    Pain Orientation Left    Pain Onset More than a month ago                               [facility] Adult PT Treatment/Exercise - 03/28/22 0001       Knee/Hip Exercises: Stretches   Passive Hamstring Stretch Left;4 reps;30  seconds    Gastroc Stretch Left;4 reps;30 seconds      Knee/Hip Exercises: Aerobic   Nustep L4 x 14 minutes      Manual Therapy   Manual Therapy Soft tissue mobilization    Soft tissue mobilization left piriformis and surrounding musculature; left gastroc/soleus                          PT Long Term Goals - 03/14/22 1109       PT LONG TERM GOAL #1   Title Patient will be independent with her HEP.    Time 4    Period Weeks    Status New    Target Date 04/11/22      PT LONG TERM GOAL #2   Title Patient will be able to return to playing golf without being limited by her familiar left hip or referred LE pain.    Time 4    Period Weeks    Status New    Target Date 04/11/22  PT LONG TERM GOAL #3   Title Patient will be able to navigate at least 4 step without being limited by her familiar left hip and lower extremity pain.    Time 4    Period Weeks    Status New    Target Date 04/11/22      PT LONG TERM GOAL #4   Title Patient will be able to transfer from sitting to standing without UE support for improved ability transfer.    Time 4    Period Weeks    Status New    Target Date 04/11/22                   Plan - 03/28/22 0834     Clinical Impression Statement Patient presented to treatment reporting increased left hip and lower extremity pain after playing golf yesterday. She was introduced to seated hamstring and gastroc stretching which provided no significant change to her symptoms. Manual therapy was able to provide slight symptom relief with soft tissue mobilization to her left hip. She may benefit from additional medical interventions due to no significant long term relief with physical therapy.    Personal Factors and Comorbidities Comorbidity 2;Time since onset of injury/illness/exacerbation    Comorbidities COPD, history of DVT    Examination-Activity Limitations Locomotion Level;Transfers;Carry;Squat;Stairs;Stand     Examination-Participation Restrictions Meal Prep;Cleaning;Community Activity;Shop;Yard Work    Stability/Clinical Decision Making Evolving/Moderate complexity    Rehab Potential Fair    PT Frequency 2x / week    PT Duration 4 weeks    PT Treatment/Interventions ADLs/Self Care Home Management;Cryotherapy;Electrical Stimulation;Moist Heat;Neuromuscular re-education;Therapeutic exercise;Therapeutic activities;Functional mobility training;Stair training;Patient/family education;Manual techniques;Dry needling;Passive range of motion    PT Next Visit Plan MT to left hip, LE strengthening, and modalities as needed    Consulted and Agree with Plan of Care Patient             Patient will benefit from skilled therapeutic intervention in order to improve the following deficits and impairments:  Difficulty walking, Decreased activity tolerance, Pain, Decreased strength  Visit Diagnosis: Pain in left hip  Pain in left lower leg     Problem List Patient Active Problem List   Diagnosis Date Noted   Hypercalcemia 02/10/2021   Osteopenia 12/26/2020   Personal history of DVT (deep vein thrombosis) 04/08/2014    Class: History of   COPD (chronic obstructive pulmonary disease) ([facility]) 05/22/2011    Jarrett C Barts, PT 03/28/2022, 9:10 AM  [facility] [address] [place], [place], [address] Phone: [phone]   Fax:  [phone]  Name: Catherine Jensen MRN: [id number] Date of Birth: 09/18/1951  PHYSICAL THERAPY DISCHARGE SUMMARY  Visits from Start of Care: 6  Current functional level related to goals / functional outcomes: Patient is being discharged at this time as she has not returned to physical therapy since her last appointment. She was unable to meet her goals for physical therapy.    Remaining deficits: Pain    Education / Equipment: HEP   Patient agrees to discharge. Patient goals were not met. Patient is being discharged due to  not returning since the last visit.   

## 2022-04-02 ENCOUNTER — Ambulatory Visit: Payer: Medicare Other | Admitting: Orthopedic Surgery

## 2022-04-02 ENCOUNTER — Ambulatory Visit (INDEPENDENT_AMBULATORY_CARE_PROVIDER_SITE_OTHER): Payer: Medicare Other

## 2022-04-02 ENCOUNTER — Encounter: Payer: Self-pay | Admitting: Orthopedic Surgery

## 2022-04-02 ENCOUNTER — Ambulatory Visit: Payer: Medicare Other

## 2022-04-02 DIAGNOSIS — M5432 Sciatica, left side: Secondary | ICD-10-CM

## 2022-04-02 NOTE — Patient Instructions (Signed)
Lumbar spine MRI - we can discuss the results and the next steps  ? ? ?After the MRI, if there is pressure on a nerve, we can try XR guided injections or referral to neurosurgery ?

## 2022-04-02 NOTE — Progress Notes (Signed)
New Patient Visit ? ?Assessment: ?Catherine Jensen is a 71 y.o. female with the following: ?1. Sciatica, left side ? ?Plan: ?Catherine Jensen has pain in the left buttock, with aching sensations in the left lower leg, including tingling over the dorsum of her left foot.  The symptoms worsen when she stands.  She does have some tenderness to palpation over the greater troches laterally, but this cannot explain all of her symptoms.  Radiographs of the lumbar spine are without anterolisthesis, but there are some degenerative changes.  Given the lack of improvement with over-the-counter pain medications, chiropractic care, as well as physical therapy, I am recommending an MRI of the lumbar spine.  This was discussed with the patient today.  She is in agreement with this plan.  Pending the results of the MRI, we can discuss proceeding with image guided injections versus referral to neurosurgery. ? ?Follow-up: ?Return for After MRI. ? ?Subjective: ? ?Chief Complaint  ?Patient presents with  ? Leg Pain  ?  Left leg pain from buttock to foot.  Has seen chiro and most recently PT, no improvement.  Takes advil or tylenol.  Hurts to sit on left buttock.  Hurts in the post/lat calf when she stands.  Pain is constant.  Asks for xray of left lower leg.   ? ? ?History of Present Illness: ?Catherine Jensen is a 71 y.o. female who has been referred by [name], FNP for evaluation of left-sided leg pain.  She has had pain in the posterior aspect of the left leg, primarily within the buttock extending distally for close to a year.  No specific injury.  She has taken Tylenol and Advil, with limited improvement in her symptoms.  She has received an IM joint injection of steroids, without improvement.  More recently, she has worked with physical therapy, and has been evaluated by a Restaurant manager, fast food.  Both these interventions have not provided any improvement in her symptoms.  She has difficulty with her usual activities.  She is  unable to do the gardening, and not able to golf as much as she would like.  When she stands, she notes an aching pain in the lateral aspect of the lower leg.  In addition, she notes tingling sensation to the dorsum of the foot.  Currently, she does not have any pain radiating down the back of the leg. ? ? ?Review of Systems: ?No fevers or chills ?+ tingling ?No chest pain ?No shortness of breath ?No bowel or bladder dysfunction ?No GI distress ?No headaches ? ? ?Medical History: ? ?Past Medical History:  ?Diagnosis Date  ? COPD (chronic obstructive pulmonary disease) ([place])   ? Endometriosis   ? FH: lung cancer   ? H/O blood clots   ? in legs  ? History of COVID-19 08/25/2021  ? Multiple allergies   ? pt states she took allergy shots x 20 yrs  ? Tobacco abuse   ? ? ?Past Surgical History:  ?Procedure Laterality Date  ? ABLATION ON ENDOMETRIOSIS    ? SKIN LESION EXCISION    ? TOTAL ABDOMINAL HYSTERECTOMY  02/1990  ? TUBAL LIGATION    ? TUMOR REMOVAL    ? ? ?Family History  ?Problem Relation Age of Onset  ? Heart attack Brother   ? Diabetes Brother   ? Kidney failure Brother   ?     24  ? Lung cancer Sister   ?     w/ mets to brain  ? Asthma  Sister   ? Crohn's disease Sister   ? Cancer Sister   ?     lung cancer  ? Heart disease Mother   ? Diabetes Mother   ? ?Social History  ? ?Tobacco Use  ? Smoking status: Former  ?  Packs/day: 0.50  ?  Years: 21.00  ?  Pack years: 10.50  ?  Types: Cigarettes  ?  Quit date: 11/26/2008  ?  Years since quitting: 13.3  ? Smokeless tobacco: Never  ?Vaping Use  ? Vaping Use: Never used  ?Substance Use Topics  ? Alcohol use: No  ? Drug use: No  ? ? ?Allergies  ?Allergen Reactions  ? Codeine   ? Musk Oil Fragrance   ?  hives  ? Walnuts [Nuts]   ? ? ?Current Meds  ?Medication Sig  ? Ascorbic Acid (VITA-C PO) Take by mouth.  ? aspirin 325 MG tablet Take 325 mg by mouth daily.  ? BIOTIN PO Take by mouth daily.  ? DENTAGEL 1.1 % GEL dental gel Place onto teeth at bedtime.  ? Lysine 1000 MG TABS  Take 1 tablet by mouth daily.  ? Multiple Vitamins-Minerals (MULTIVITAMIN WITH MINERALS) tablet Take 1 tablet by mouth daily.  ? ? ?Objective: ?LMP 02/24/1990 Comment: [facility] does pap,Deborah Hollice Espy CNM ? ?Physical Exam: ? ?General: Elderly female., Alert and oriented., and No acute distress. ?Gait: Left sided antalgic gait. ? ?Evaluation of the left hip demonstrates no deformity.  She does have tenderness to palpation directly over the lateral aspect of the greater trochanter.  Tenderness to palpation within the left buttock.  Negative straight leg raise, although she does note some tightness in the back of her leg.  There is no tenderness to palpation in the lateral aspect of the lower leg, or she complains of pain.  No bruising in this area.  Strength in the left leg is 5/5.  3+ patellar tendon reflexes bilaterally.  Sensation is intact to the dorsum of her foot. ? ?IMAGING: ?I personally ordered and reviewed the following images ? ?Standing lumbar spine x-rays obtained in clinic today demonstrates no acute injury.  Relatively good disc height is maintained.  No anterolisthesis.  There are some small anterior osteophytes throughout the lumbar spine.  Maintained lordosis of the lumbar spine. ? ?Impression: Lumbar spine x-ray, with diffuse mild degenerative changes overall ? ?New Medications:  ?No orders of the defined types were placed in this encounter. ? ? ? ? ?Mordecai Rasmussen, MD ? ?04/02/2022 ?11:03 PM ? ? ?

## 2022-04-05 ENCOUNTER — Encounter: Payer: Medicare Other | Admitting: Physical Therapy

## 2022-04-10 ENCOUNTER — Telehealth: Payer: Self-pay | Admitting: Family

## 2022-04-10 NOTE — Telephone Encounter (Signed)
Pt scheduled with Christy 04/24/22 at 3:40. ?

## 2022-04-10 NOTE — Telephone Encounter (Signed)
Yes, please schedule her with me.  ?

## 2022-04-10 NOTE — Telephone Encounter (Signed)
Patient calling to see if she would be able to come in to get another shot before her MRI on 6/1. Please call back to advise.  ?

## 2022-04-19 DIAGNOSIS — G629 Polyneuropathy, unspecified: Secondary | ICD-10-CM | POA: Insufficient documentation

## 2022-04-19 DIAGNOSIS — M5451 Vertebrogenic low back pain: Secondary | ICD-10-CM | POA: Diagnosis not present

## 2022-04-19 DIAGNOSIS — M5459 Other low back pain: Secondary | ICD-10-CM | POA: Diagnosis not present

## 2022-04-24 ENCOUNTER — Ambulatory Visit: Payer: Medicare Other | Admitting: Family

## 2022-04-26 ENCOUNTER — Other Ambulatory Visit (HOSPITAL_COMMUNITY): Payer: Medicare Other

## 2022-05-02 DIAGNOSIS — M5451 Vertebrogenic low back pain: Secondary | ICD-10-CM | POA: Diagnosis not present

## 2022-05-07 ENCOUNTER — Ambulatory Visit: Payer: Medicare Other | Admitting: Orthopedic Surgery

## 2022-05-09 DIAGNOSIS — M5451 Vertebrogenic low back pain: Secondary | ICD-10-CM | POA: Diagnosis not present

## 2022-05-15 DIAGNOSIS — M5416 Radiculopathy, lumbar region: Secondary | ICD-10-CM | POA: Diagnosis not present

## 2022-05-25 DIAGNOSIS — M5416 Radiculopathy, lumbar region: Secondary | ICD-10-CM | POA: Diagnosis not present

## 2022-05-25 DIAGNOSIS — M5451 Vertebrogenic low back pain: Secondary | ICD-10-CM | POA: Diagnosis not present

## 2022-05-28 ENCOUNTER — Ambulatory Visit (INDEPENDENT_AMBULATORY_CARE_PROVIDER_SITE_OTHER): Payer: Medicare Other | Admitting: Family Medicine

## 2022-05-28 ENCOUNTER — Encounter: Payer: Self-pay | Admitting: Family Medicine

## 2022-05-28 VITALS — BP 173/73 | HR 63 | Temp 98.0°F | Ht 59.0 in | Wt 91.0 lb

## 2022-05-28 DIAGNOSIS — Z01818 Encounter for other preprocedural examination: Secondary | ICD-10-CM | POA: Diagnosis not present

## 2022-05-28 DIAGNOSIS — Z136 Encounter for screening for cardiovascular disorders: Secondary | ICD-10-CM | POA: Diagnosis not present

## 2022-05-28 LAB — COAGUCHEK XS/INR WAIVED
INR: 0.9 (ref 0.9–1.1)
Prothrombin Time: 11 s

## 2022-05-28 NOTE — Progress Notes (Signed)
BP (!) 173/73   Pulse 63   Temp 98 F (36.7 C)   Ht $R'4\' 11"'BY$  (1.499 m)   Wt 91 lb (41.3 kg)   LMP 02/24/1990 Comment: [facility] does pap,[name] CNM  SpO2 98%   BMI 18.38 kg/m    Subjective:   Patient ID: Catherine Jensen, female    DOB: 03-27-1951, 71 y.o.   MRN: [id number]  HPI: Catherine Jensen is a 71 y.o. female presenting on 05/28/2022 for surgical clearance   HPI Preoperative physical Patient is coming in with Dr. Rolena Infante for spinal decompression and bone spur removal.  Patient is coming today for preoperative physical exam and says that she golfs normally every day except that this pain prevents her from do that.  She says she gets a little winded because of her history of smoking but she quit 9 years ago and that has not changed but she still keeps pretty active and golfing and moving around in the garden and doing things like that on a regular basis and does not get short of breath doing those things.  She does not get short of breath walking in here from the road.  Relevant past medical, surgical, family and social history reviewed and updated as indicated. Interim medical history since our last visit reviewed. Allergies and medications reviewed and updated.  Review of Systems  Constitutional:  Negative for chills and fever.  Eyes:  Negative for visual disturbance.  Respiratory:  Negative for chest tightness and shortness of breath.   Cardiovascular:  Negative for chest pain and leg swelling.  Genitourinary:  Negative for difficulty urinating and dysuria.  Musculoskeletal:  Negative for back pain and gait problem.  Skin:  Negative for rash.  Neurological:  Negative for dizziness, light-headedness and headaches.  Psychiatric/Behavioral:  Negative for agitation and behavioral problems.   All other systems reviewed and are negative.   Per HPI unless specifically indicated above   Allergies as of 05/28/2022       Reactions   Codeine    Musk Oil  Fragrance    hives   Walnuts [nuts]         Medication List        Accurate as of May 28, 2022  3:28 PM. If you have any questions, ask your nurse or doctor.          aspirin 325 MG tablet Take 325 mg by mouth daily.   BIOTIN PO Take by mouth daily.   DentaGel 1.1 % Gel dental gel Generic drug: sodium fluoride Place onto teeth at bedtime.   Lysine 1000 MG Tabs Take 1 tablet by mouth daily.   multivitamin with minerals tablet Take 1 tablet by mouth daily.   VITA-C PO Take by mouth.         Objective:   BP (!) 173/73   Pulse 63   Temp 98 F (36.7 C)   Ht $R'4\' 11"'mK$  (1.499 m)   Wt 91 lb (41.3 kg)   LMP 02/24/1990 Comment: [facility] does pap,[name] CNM  SpO2 98%   BMI 18.38 kg/m   Wt Readings from Last 3 Encounters:  05/28/22 91 lb (41.3 kg)  03/06/22 92 lb (41.7 kg)  01/05/22 94 lb (42.6 kg)    Physical Exam Vitals and nursing note reviewed.  Constitutional:      General: She is not in acute distress.    Appearance: She is well-developed. She is not diaphoretic.  Eyes:  Conjunctiva/sclera: Conjunctivae normal.     Pupils: Pupils are equal, round, and reactive to light.  Cardiovascular:     Rate and Rhythm: Normal rate and regular rhythm.     Heart sounds: Normal heart sounds. No murmur heard. Pulmonary:     Effort: Pulmonary effort is normal. No respiratory distress.     Breath sounds: Normal breath sounds. No wheezing.  Musculoskeletal:        General: No swelling or tenderness. Normal range of motion.  Skin:    General: Skin is warm and dry.     Findings: No rash.  Neurological:     Mental Status: She is alert and oriented to person, place, and time.     Coordination: Coordination normal.  Psychiatric:        Behavior: Behavior normal.       Assessment & Plan:   Problem List Items Addressed This Visit   None Visit Diagnoses     Preoperative clearance    -  Primary   Relevant Orders   EKG 12-Lead (Completed)    CBC with Differential/Platelet   CMP14+EGFR   Lipid panel   CoaguChek XS/INR Waived       Patient has METS greater than 4, looks good, physical activity and heart looks good.  Patient's blood pressure on recheck came back 140/73 which looks good, as long as blood work comes back good we will clear for surgery   Follow up plan: Return if symptoms worsen or fail to improve.  Counseling provided for all of the vaccine components Orders Placed This Encounter  Procedures   CBC with Differential/Platelet   CMP14+EGFR   Lipid panel   CoaguChek XS/INR Waived   EKG 12-Lead    Caryl Pina, MD [facility] 05/28/2022, 3:28 PM

## 2022-05-29 LAB — CMP14+EGFR
ALT: 15 IU/L (ref 0–32)
AST: 20 IU/L (ref 0–40)
Albumin/Globulin Ratio: 2.7 — ABNORMAL HIGH (ref 1.2–2.2)
Albumin: 4.9 g/dL — ABNORMAL HIGH (ref 3.8–4.8)
Alkaline Phosphatase: 77 IU/L (ref 44–121)
BUN/Creatinine Ratio: 15 (ref 12–28)
BUN: 10 mg/dL (ref 8–27)
Bilirubin Total: <0.2 mg/dL (ref 0.0–1.2)
CO2: 26 mmol/L (ref 20–29)
Calcium: 10.4 mg/dL — ABNORMAL HIGH (ref 8.7–10.3)
Chloride: 103 mmol/L (ref 96–106)
Creatinine, Ser: 0.66 mg/dL (ref 0.57–1.00)
Globulin, Total: 1.8 g/dL (ref 1.5–4.5)
Glucose: 91 mg/dL (ref 70–99)
Potassium: 4 mmol/L (ref 3.5–5.2)
Sodium: 143 mmol/L (ref 134–144)
Total Protein: 6.7 g/dL (ref 6.0–8.5)
eGFR: 94 mL/min/{1.73_m2} (ref >59)

## 2022-05-29 LAB — CBC WITH DIFFERENTIAL/PLATELET
Basophils Absolute: 0 10*3/uL (ref 0.0–0.2)
Basos: 1 %
EOS (ABSOLUTE): 0.1 10*3/uL (ref 0.0–0.4)
Eos: 2 %
Hematocrit: 42 % (ref 34.0–46.6)
Hemoglobin: 14.5 g/dL (ref 11.1–15.9)
Immature Grans (Abs): 0 10*3/uL (ref 0.0–0.1)
Immature Granulocytes: 0 %
Lymphocytes Absolute: 1.4 10*3/uL (ref 0.7–3.1)
Lymphs: 24 %
MCH: 33 pg (ref 26.6–33.0)
MCHC: 34.5 g/dL (ref 31.5–35.7)
MCV: 96 fL (ref 79–97)
Monocytes Absolute: 0.3 10*3/uL (ref 0.1–0.9)
Monocytes: 6 %
Neutrophils Absolute: 3.9 10*3/uL (ref 1.4–7.0)
Neutrophils: 67 %
Platelets: 220 10*3/uL (ref 150–450)
RBC: 4.4 x10E6/uL (ref 3.77–5.28)
RDW: 11.4 % — ABNORMAL LOW (ref 11.7–15.4)
WBC: 5.8 10*3/uL (ref 3.4–10.8)

## 2022-05-29 LAB — LIPID PANEL
Chol/HDL Ratio: 2.5 ratio (ref 0.0–4.4)
Cholesterol, Total: 212 mg/dL — ABNORMAL HIGH (ref 100–199)
HDL: 84 mg/dL (ref >39)
LDL Chol Calc (NIH): 111 mg/dL — ABNORMAL HIGH (ref 0–99)
Triglycerides: 100 mg/dL (ref 0–149)
VLDL Cholesterol Cal: 17 mg/dL (ref 5–40)

## 2022-05-30 ENCOUNTER — Telehealth: Payer: Self-pay

## 2022-05-30 NOTE — Telephone Encounter (Signed)
Faxed [facility] note, surgical clearance form, labs and EKG.  Fax # [phone]

## 2022-06-05 ENCOUNTER — Ambulatory Visit: Payer: Self-pay | Admitting: Orthopedic Surgery

## 2022-06-15 DIAGNOSIS — Z4889 Encounter for other specified surgical aftercare: Secondary | ICD-10-CM | POA: Diagnosis not present

## 2022-06-15 NOTE — Pre-Procedure Instructions (Signed)
Surgical Instructions    Your procedure is scheduled on June 21, 2022.  Report to [facility] Main Entrance "A" at 12:00 P.M., then check in with the Admitting office.  Call this number if you have problems the morning of surgery:  [phone]   If you have any questions prior to your surgery date call [phone]: Open Monday-Friday 8am-4pm    Remember:  Do not eat after midnight the night before your surgery  You may drink clear liquids until 11:00 AM the morning of your surgery.   Clear liquids allowed are: Water, Non-Citrus Juices (without pulp), Carbonated Beverages, Clear Tea, Black Coffee Only (NO MILK, CREAM OR POWDERED CREAMER of any kind), and Gatorade.    Take these medicines the morning of surgery with A SIP OF WATER:  famotidine (PEPCID)    Take these medicines the morning of surgery AS NEEDED:  acetaminophen (TYLENOL)   diphenhydrAMINE (BENADRYL)     As of today, STOP taking any Aspirin (unless otherwise instructed by your surgeon) Aleve, Naproxen, Ibuprofen, Motrin, Advil, Goody's, BC's, all herbal medications, fish oil, and all vitamins.                     Do NOT Smoke (Tobacco/Vaping) for 24 hours prior to your procedure.  If you use a CPAP at night, you may bring your mask/headgear for your overnight stay.   Contacts, glasses, piercing's, hearing aid's, dentures or partials may not be worn into surgery, please bring cases for these belongings.    For patients admitted to the hospital, discharge time will be determined by your treatment team.   Patients discharged the day of surgery will not be allowed to drive home, and someone needs to stay with them for 24 hours.  SURGICAL WAITING ROOM VISITATION Patients having surgery or a procedure may have no more than 2 support people in the waiting area - these visitors may rotate.   Children under the age of 55 must have an adult with them who is not the patient. If the patient needs to stay at the hospital  during part of their recovery, the visitor guidelines for inpatient rooms apply. Pre-op nurse will coordinate an appropriate time for 1 support person to accompany patient in pre-op.  This support person may not rotate.   Please refer to the [facility] website for the visitor guidelines for Inpatients (after your surgery is over and you are in a regular room).    Special instructions:   [place]- Preparing For Surgery  Before surgery, you can play an important role. Because skin is not sterile, your skin needs to be as free of germs as possible. You can reduce the number of germs on your skin by washing with CHG (chlorahexidine gluconate) Soap before surgery.  CHG is an antiseptic cleaner which kills germs and bonds with the skin to continue killing germs even after washing.    Oral Hygiene is also important to reduce your risk of infection.  Remember - BRUSH YOUR TEETH THE MORNING OF SURGERY WITH YOUR REGULAR TOOTHPASTE  Please do not use if you have an allergy to CHG or antibacterial soaps. If your skin becomes reddened/irritated stop using the CHG.  Do not shave (including legs and underarms) for at least 48 hours prior to first CHG shower. It is OK to shave your face.  Please follow these instructions carefully.   Shower the NIGHT BEFORE SURGERY and the MORNING OF SURGERY  If you chose to wash your hair,  wash your hair first as usual with your normal shampoo.  After you shampoo, rinse your hair and body thoroughly to remove the shampoo.  Use CHG Soap as you would any other liquid soap. You can apply CHG directly to the skin and wash gently with a scrungie or a clean washcloth.   Apply the CHG Soap to your body ONLY FROM THE NECK DOWN.  Do not use on open wounds or open sores. Avoid contact with your eyes, ears, mouth and genitals (private parts). Wash Face and genitals (private parts)  with your normal soap.   Wash thoroughly, paying special attention to the area where your surgery  will be performed.  Thoroughly rinse your body with warm water from the neck down.  DO NOT shower/wash with your normal soap after using and rinsing off the CHG Soap.  Pat yourself dry with a CLEAN TOWEL.  Wear CLEAN PAJAMAS to bed the night before surgery  Place CLEAN SHEETS on your bed the night before your surgery  DO NOT SLEEP WITH PETS.   Day of Surgery: Take a shower with CHG soap. Do not wear jewelry or makeup Do not wear lotions, powders, perfumes/colognes, or deodorant. Do not shave 48 hours prior to surgery.  Men may shave face and neck. Do not bring valuables to the hospital.  [facility] is not responsible for any belongings or valuables. Do not wear nail polish, gel polish, artificial nails, or any other type of covering on natural nails (fingers and toes) If you have artificial nails or gel coating that need to be removed by a nail salon, please have this removed prior to surgery. Artificial nails or gel coating may interfere with anesthesia's ability to adequately monitor your vital signs.  Wear Clean/Comfortable clothing the morning of surgery  Remember to brush your teeth WITH YOUR REGULAR TOOTHPASTE.   Please read over the following fact sheets that you were given.    If you received a COVID test during your pre-op visit  it is requested that you wear a mask when out in public, stay away from anyone that may not be feeling well and notify your surgeon if you develop symptoms. If you have been in contact with anyone that has tested positive in the last 10 days please notify you surgeon.

## 2022-06-18 ENCOUNTER — Encounter (HOSPITAL_COMMUNITY): Payer: Self-pay

## 2022-06-18 ENCOUNTER — Encounter (HOSPITAL_COMMUNITY)
Admission: RE | Admit: 2022-06-18 | Discharge: 2022-06-18 | Disposition: A | Discharge status: Home / Self Care | Payer: Medicare Other | Source: Ambulatory Visit | Attending: Orthopedic Surgery | Admitting: Orthopedic Surgery

## 2022-06-18 ENCOUNTER — Other Ambulatory Visit: Payer: Self-pay

## 2022-06-18 VITALS — BP 136/72 | HR 80 | Temp 98.3°F | Resp 18 | Ht 59.0 in | Wt 90.1 lb

## 2022-06-18 DIAGNOSIS — Z01812 Encounter for preprocedural laboratory examination: Secondary | ICD-10-CM | POA: Diagnosis not present

## 2022-06-18 DIAGNOSIS — Z01818 Encounter for other preprocedural examination: Secondary | ICD-10-CM

## 2022-06-18 LAB — SURGICAL PCR SCREEN
MRSA, PCR: NEGATIVE
Staphylococcus aureus: NEGATIVE

## 2022-06-18 NOTE — Progress Notes (Signed)
PCP -   Sharion Balloon, FNP   Cardiologist - denies  PPM/ICD - denies Chest x-ray - N/A EKG - 05/28/22 Stress Test - denies ECHO - denies Cardiac Cath - denies  Sleep Study - denies   Aspirin Instructions:N/A  ERAS Protcol -ERAS per order PRE-SURGERY Ensure or G2-   COVID TEST- N/A   Anesthesia review: N/A  Patient denies shortness of breath, fever, cough and chest pain at PAT appointment   All instructions explained to the patient, with a verbal understanding of the material. Patient agrees to go over the instructions while at home for a better understanding. Patient also instructed to self quarantine after being tested for COVID-19. The opportunity to ask questions was provided.

## 2022-06-21 ENCOUNTER — Ambulatory Visit (HOSPITAL_BASED_OUTPATIENT_CLINIC_OR_DEPARTMENT_OTHER): Payer: Medicare Other | Admitting: Certified Registered Nurse Anesthetist

## 2022-06-21 ENCOUNTER — Other Ambulatory Visit: Payer: Self-pay

## 2022-06-21 ENCOUNTER — Observation Stay (HOSPITAL_COMMUNITY)
Admission: RE | Admit: 2022-06-21 | Discharge: 2022-06-22 | Disposition: A | Discharge status: Home / Self Care | Payer: Medicare Other | Attending: Orthopedic Surgery | Admitting: Orthopedic Surgery

## 2022-06-21 ENCOUNTER — Ambulatory Visit (HOSPITAL_COMMUNITY): Payer: Medicare Other | Admitting: Certified Registered Nurse Anesthetist

## 2022-06-21 ENCOUNTER — Encounter (HOSPITAL_COMMUNITY)
Admission: RE | Disposition: A | Discharge status: Home / Self Care | Payer: Self-pay | Source: Home / Self Care | Attending: Orthopedic Surgery

## 2022-06-21 ENCOUNTER — Ambulatory Visit (HOSPITAL_COMMUNITY): Payer: Medicare Other

## 2022-06-21 ENCOUNTER — Encounter (HOSPITAL_COMMUNITY): Payer: Self-pay | Admitting: Orthopedic Surgery

## 2022-06-21 DIAGNOSIS — Z981 Arthrodesis status: Secondary | ICD-10-CM | POA: Diagnosis not present

## 2022-06-21 DIAGNOSIS — Z9889 Other specified postprocedural states: Secondary | ICD-10-CM

## 2022-06-21 DIAGNOSIS — M5417 Radiculopathy, lumbosacral region: Secondary | ICD-10-CM | POA: Diagnosis not present

## 2022-06-21 DIAGNOSIS — M5416 Radiculopathy, lumbar region: Secondary | ICD-10-CM | POA: Diagnosis not present

## 2022-06-21 DIAGNOSIS — M48061 Spinal stenosis, lumbar region without neurogenic claudication: Secondary | ICD-10-CM

## 2022-06-21 DIAGNOSIS — Z8616 Personal history of COVID-19: Secondary | ICD-10-CM | POA: Insufficient documentation

## 2022-06-21 DIAGNOSIS — Z79899 Other long term (current) drug therapy: Secondary | ICD-10-CM | POA: Diagnosis not present

## 2022-06-21 DIAGNOSIS — Z87891 Personal history of nicotine dependence: Secondary | ICD-10-CM | POA: Diagnosis not present

## 2022-06-21 DIAGNOSIS — J449 Chronic obstructive pulmonary disease, unspecified: Secondary | ICD-10-CM | POA: Diagnosis not present

## 2022-06-21 DIAGNOSIS — M4807 Spinal stenosis, lumbosacral region: Secondary | ICD-10-CM | POA: Diagnosis not present

## 2022-06-21 HISTORY — PX: LUMBAR LAMINECTOMY/DECOMPRESSION MICRODISCECTOMY: SHX5026

## 2022-06-21 SURGERY — LUMBAR LAMINECTOMY/DECOMPRESSION MICRODISCECTOMY 1 LEVEL
Anesthesia: General | Site: Spine Lumbar | Laterality: Left

## 2022-06-21 MED ORDER — CHLORHEXIDINE GLUCONATE 0.12 % MT SOLN: 15.0000 mL | Freq: Once | OROMUCOSAL | Status: AC

## 2022-06-21 MED ORDER — PROPOFOL 500 MG/50ML IV EMUL
INTRAVENOUS | Status: DC | PRN
  Administered 2022-06-21: 25 ug/kg/min via INTRAVENOUS

## 2022-06-21 MED ORDER — FENTANYL CITRATE (PF) 250 MCG/5ML IJ SOLN
INTRAMUSCULAR | Status: DC | PRN
  Administered 2022-06-21 (×2): 50 ug via INTRAVENOUS

## 2022-06-21 MED ORDER — ONDANSETRON HCL 4 MG PO TABS: 4.0000 mg | ORAL_TABLET | Freq: Three times a day (TID) | ORAL | 0 refills | Status: DC | PRN

## 2022-06-21 MED ORDER — LIDOCAINE 2% (20 MG/ML) 5 ML SYRINGE
INTRAMUSCULAR | Status: DC | PRN
  Administered 2022-06-21: 60 mg via INTRAVENOUS

## 2022-06-21 MED ORDER — OXYCODONE-ACETAMINOPHEN 10-325 MG PO TABS: 1.0000 | ORAL_TABLET | Freq: Four times a day (QID) | ORAL | 0 refills | Status: AC | PRN

## 2022-06-21 MED ORDER — PHENOL 1.4 % MT LIQD: 1.0000 | OROMUCOSAL | Status: DC | PRN

## 2022-06-21 MED ORDER — METHOCARBAMOL 500 MG PO TABS
ORAL_TABLET | ORAL | Status: AC
  Filled 2022-06-21: qty 1

## 2022-06-21 MED ORDER — ACETAMINOPHEN 10 MG/ML IV SOLN
1000.0000 mg | Freq: Once | INTRAVENOUS | Status: DC | PRN
  Administered 2022-06-21: 1000 mg via INTRAVENOUS

## 2022-06-21 MED ORDER — BUPIVACAINE-EPINEPHRINE 0.25% -1:200000 IJ SOLN
INTRAMUSCULAR | Status: DC | PRN
  Administered 2022-06-21: 10 mL

## 2022-06-21 MED ORDER — OXYCODONE HCL 5 MG PO TABS
ORAL_TABLET | ORAL | Status: AC
  Filled 2022-06-21: qty 1

## 2022-06-21 MED ORDER — SUGAMMADEX SODIUM 200 MG/2ML IV SOLN
INTRAVENOUS | Status: DC | PRN
  Administered 2022-06-21: 81.6 mg via INTRAVENOUS

## 2022-06-21 MED ORDER — ACETAMINOPHEN 500 MG PO TABS: 1000.0000 mg | ORAL_TABLET | Freq: Once | ORAL | Status: DC | PRN

## 2022-06-21 MED ORDER — TRANEXAMIC ACID 1000 MG/10ML IV SOLN
2000.0000 mg | Freq: Once | INTRAVENOUS | Status: AC
Start: 2022-06-21 — End: 2022-06-21
  Administered 2022-06-21: 2000 mg via TOPICAL
  Filled 2022-06-21: qty 20

## 2022-06-21 MED ORDER — THROMBIN 20000 UNITS EX SOLR: CUTANEOUS | Status: DC | PRN

## 2022-06-21 MED ORDER — FLEET ENEMA 7-19 GM/118ML RE ENEM: 1.0000 | ENEMA | Freq: Once | RECTAL | Status: DC | PRN

## 2022-06-21 MED ORDER — PHENYLEPHRINE HCL-NACL 20-0.9 MG/250ML-% IV SOLN
INTRAVENOUS | Status: DC | PRN
  Administered 2022-06-21: 25 ug/min via INTRAVENOUS

## 2022-06-21 MED ORDER — LACTATED RINGERS IV SOLN
INTRAVENOUS | Status: DC
Start: 2022-06-21 — End: 2022-06-22

## 2022-06-21 MED ORDER — MIDAZOLAM HCL 2 MG/2ML IJ SOLN
INTRAMUSCULAR | Status: AC
  Filled 2022-06-21: qty 2

## 2022-06-21 MED ORDER — CEFAZOLIN SODIUM-DEXTROSE 2-4 GM/100ML-% IV SOLN
INTRAVENOUS | Status: AC
  Filled 2022-06-21: qty 100

## 2022-06-21 MED ORDER — SODIUM CHLORIDE 0.9% FLUSH
3.0000 mL | Freq: Two times a day (BID) | INTRAVENOUS | Status: DC
Start: 2022-06-21 — End: 2022-06-22

## 2022-06-21 MED ORDER — ACETAMINOPHEN 10 MG/ML IV SOLN
INTRAVENOUS | Status: AC
  Filled 2022-06-21: qty 100

## 2022-06-21 MED ORDER — PROPOFOL 10 MG/ML IV BOLUS
INTRAVENOUS | Status: DC | PRN
  Administered 2022-06-21: 80 mg via INTRAVENOUS

## 2022-06-21 MED ORDER — FENTANYL CITRATE (PF) 100 MCG/2ML IJ SOLN
INTRAMUSCULAR | Status: AC
  Filled 2022-06-21: qty 2

## 2022-06-21 MED ORDER — POLYETHYLENE GLYCOL 3350 17 G PO PACK: 17.0000 g | PACK | Freq: Every day | ORAL | Status: DC | PRN

## 2022-06-21 MED ORDER — METHOCARBAMOL 1000 MG/10ML IJ SOLN: 500.0000 mg | Freq: Four times a day (QID) | INTRAVENOUS | Status: DC | PRN

## 2022-06-21 MED ORDER — ONDANSETRON HCL 4 MG/2ML IJ SOLN
INTRAMUSCULAR | Status: DC | PRN
  Administered 2022-06-21: 4 mg via INTRAVENOUS

## 2022-06-21 MED ORDER — SODIUM CHLORIDE 0.9 % IV SOLN: INTRAVENOUS | Status: DC

## 2022-06-21 MED ORDER — FENTANYL CITRATE (PF) 250 MCG/5ML IJ SOLN
INTRAMUSCULAR | Status: AC
  Filled 2022-06-21: qty 5

## 2022-06-21 MED ORDER — LACTATED RINGERS IV SOLN: INTRAVENOUS | Status: DC

## 2022-06-21 MED ORDER — ROCURONIUM BROMIDE 10 MG/ML (PF) SYRINGE
PREFILLED_SYRINGE | INTRAVENOUS | Status: DC | PRN
  Administered 2022-06-21: 50 mg via INTRAVENOUS

## 2022-06-21 MED ORDER — ACETAMINOPHEN 160 MG/5ML PO SOLN: 1000.0000 mg | Freq: Once | ORAL | Status: DC | PRN

## 2022-06-21 MED ORDER — SURGIFLO WITH THROMBIN (HEMOSTATIC MATRIX KIT) OPTIME
TOPICAL | Status: DC | PRN
  Administered 2022-06-21: 1 via TOPICAL

## 2022-06-21 MED ORDER — DEXAMETHASONE SODIUM PHOSPHATE 10 MG/ML IJ SOLN
INTRAMUSCULAR | Status: DC | PRN
  Administered 2022-06-21: 10 mg via INTRAVENOUS

## 2022-06-21 MED ORDER — THROMBIN 20000 UNITS EX SOLR
CUTANEOUS | Status: AC
  Filled 2022-06-21: qty 20000

## 2022-06-21 MED ORDER — MIDAZOLAM HCL 2 MG/2ML IJ SOLN
INTRAMUSCULAR | Status: DC | PRN
  Administered 2022-06-21: 1 mg via INTRAVENOUS

## 2022-06-21 MED ORDER — ACETAMINOPHEN 325 MG PO TABS
650.0000 mg | ORAL_TABLET | ORAL | Status: DC | PRN
  Administered 2022-06-21 – 2022-06-22 (×4): 650 mg via ORAL
  Filled 2022-06-21 (×4): qty 2

## 2022-06-21 MED ORDER — OXYCODONE HCL 5 MG/5ML PO SOLN: 5.0000 mg | Freq: Once | ORAL | Status: AC | PRN

## 2022-06-21 MED ORDER — HYDROMORPHONE HCL 1 MG/ML IJ SOLN: 0.5000 mg | INTRAMUSCULAR | Status: DC | PRN

## 2022-06-21 MED ORDER — ONDANSETRON HCL 4 MG PO TABS: 4.0000 mg | ORAL_TABLET | Freq: Four times a day (QID) | ORAL | Status: DC | PRN

## 2022-06-21 MED ORDER — GABAPENTIN 300 MG PO CAPS
300.0000 mg | ORAL_CAPSULE | Freq: Three times a day (TID) | ORAL | Status: DC
  Administered 2022-06-21 (×2): 300 mg via ORAL
  Filled 2022-06-21 (×2): qty 1

## 2022-06-21 MED ORDER — OXYCODONE HCL 5 MG PO TABS
5.0000 mg | ORAL_TABLET | ORAL | Status: DC | PRN
  Administered 2022-06-21: 5 mg via ORAL
  Filled 2022-06-21: qty 1

## 2022-06-21 MED ORDER — FENTANYL CITRATE (PF) 100 MCG/2ML IJ SOLN
25.0000 ug | INTRAMUSCULAR | Status: DC | PRN
  Administered 2022-06-21: 50 ug via INTRAVENOUS

## 2022-06-21 MED ORDER — ORAL CARE MOUTH RINSE: 15.0000 mL | Freq: Once | OROMUCOSAL | Status: AC

## 2022-06-21 MED ORDER — BUPIVACAINE-EPINEPHRINE (PF) 0.25% -1:200000 IJ SOLN
INTRAMUSCULAR | Status: AC
  Filled 2022-06-21: qty 30

## 2022-06-21 MED ORDER — METHOCARBAMOL 500 MG PO TABS: 500.0000 mg | ORAL_TABLET | Freq: Four times a day (QID) | ORAL | Status: DC | PRN

## 2022-06-21 MED ORDER — SODIUM CHLORIDE 0.9% FLUSH: 3.0000 mL | INTRAVENOUS | Status: DC | PRN

## 2022-06-21 MED ORDER — CEFAZOLIN SODIUM-DEXTROSE 2-4 GM/100ML-% IV SOLN
2.0000 g | INTRAVENOUS | Status: AC
  Administered 2022-06-21: 2 g via INTRAVENOUS

## 2022-06-21 MED ORDER — ONDANSETRON HCL 4 MG/2ML IJ SOLN: 4.0000 mg | Freq: Four times a day (QID) | INTRAMUSCULAR | Status: DC | PRN

## 2022-06-21 MED ORDER — MENTHOL 3 MG MT LOZG
1.0000 | LOZENGE | OROMUCOSAL | Status: DC | PRN
Start: 2022-06-21 — End: 2022-06-22

## 2022-06-21 MED ORDER — CEFAZOLIN SODIUM-DEXTROSE 1-4 GM/50ML-% IV SOLN
1.0000 g | Freq: Three times a day (TID) | INTRAVENOUS | Status: AC
  Administered 2022-06-21 – 2022-06-22 (×2): 1 g via INTRAVENOUS
  Filled 2022-06-21 (×2): qty 50

## 2022-06-21 MED ORDER — OXYCODONE HCL 5 MG PO TABS: 10.0000 mg | ORAL_TABLET | ORAL | Status: DC | PRN

## 2022-06-21 MED ORDER — METHOCARBAMOL 500 MG PO TABS: 500.0000 mg | ORAL_TABLET | Freq: Three times a day (TID) | ORAL | 0 refills | Status: AC | PRN

## 2022-06-21 MED ORDER — OXYCODONE HCL 5 MG PO TABS
5.0000 mg | ORAL_TABLET | Freq: Once | ORAL | Status: AC | PRN
  Administered 2022-06-21: 5 mg via ORAL

## 2022-06-21 MED ORDER — CHLORHEXIDINE GLUCONATE 0.12 % MT SOLN
OROMUCOSAL | Status: AC
  Administered 2022-06-21: 15 mL via OROMUCOSAL
  Filled 2022-06-21: qty 15

## 2022-06-21 MED ORDER — 0.9 % SODIUM CHLORIDE (POUR BTL) OPTIME
TOPICAL | Status: DC | PRN
  Administered 2022-06-21: 1000 mL

## 2022-06-21 MED ORDER — ACETAMINOPHEN 650 MG RE SUPP: 650.0000 mg | RECTAL | Status: DC | PRN

## 2022-06-21 MED ORDER — METHYLPREDNISOLONE ACETATE 40 MG/ML IJ SUSP
INTRAMUSCULAR | Status: AC
  Filled 2022-06-21: qty 1

## 2022-06-21 SURGICAL SUPPLY — 57 items
AGENT HMST KT MTR STRL THRMB (HEMOSTASIS) ×1
BAG COUNTER SPONGE SURGICOUNT (BAG) ×3 IMPLANT
BAG SPNG CNTER NS LX DISP (BAG) ×1
BNDG GAUZE ELAST 4 BULKY (GAUZE/BANDAGES/DRESSINGS) ×3 IMPLANT
CANISTER SUCT 3000ML PPV (MISCELLANEOUS) ×3 IMPLANT
CLSR STERI-STRIP ANTIMIC 1/2X4 (GAUZE/BANDAGES/DRESSINGS) ×3 IMPLANT
CORD BIPOLAR FORCEPS 12FT (ELECTRODE) ×3 IMPLANT
COVER SURGICAL LIGHT HANDLE (MISCELLANEOUS) ×3 IMPLANT
DRAIN CHANNEL 15F RND FF W/TCR (WOUND CARE) IMPLANT
DRAPE SURG 17X23 STRL (DRAPES) ×3 IMPLANT
DRAPE U-SHAPE 47X51 STRL (DRAPES) ×3 IMPLANT
DRSG OPSITE POSTOP 3X4 (GAUZE/BANDAGES/DRESSINGS) ×2 IMPLANT
DRSG OPSITE POSTOP 4X6 (GAUZE/BANDAGES/DRESSINGS) ×1 IMPLANT
DURAPREP 26ML APPLICATOR (WOUND CARE) ×3 IMPLANT
ELECT BLADE 4.0 EZ CLEAN MEGAD (MISCELLANEOUS)
ELECT CAUTERY BLADE 6.4 (BLADE) ×3 IMPLANT
ELECT PENCIL ROCKER SW 15FT (MISCELLANEOUS) ×3 IMPLANT
ELECT REM PT RETURN 9FT ADLT (ELECTROSURGICAL) ×2
ELECTRODE BLDE 4.0 EZ CLN MEGD (MISCELLANEOUS) IMPLANT
ELECTRODE REM PT RTRN 9FT ADLT (ELECTROSURGICAL) ×2 IMPLANT
EVACUATOR SILICONE 100CC (DRAIN) IMPLANT
GLOVE BIO SURGEON STRL SZ 6.5 (GLOVE) ×3 IMPLANT
GLOVE BIOGEL PI IND STRL 6.5 (GLOVE) ×2 IMPLANT
GLOVE BIOGEL PI IND STRL 8.5 (GLOVE) ×2 IMPLANT
GLOVE BIOGEL PI INDICATOR 6.5 (GLOVE) ×1
GLOVE BIOGEL PI INDICATOR 8.5 (GLOVE) ×1
GLOVE SS BIOGEL STRL SZ 8.5 (GLOVE) ×2 IMPLANT
GLOVE SUPERSENSE BIOGEL SZ 8.5 (GLOVE) ×1
GOWN STRL REUS W/ TWL LRG LVL3 (GOWN DISPOSABLE) ×4 IMPLANT
GOWN STRL REUS W/TWL 2XL LVL3 (GOWN DISPOSABLE) ×3 IMPLANT
GOWN STRL REUS W/TWL LRG LVL3 (GOWN DISPOSABLE) ×4
KIT BASIN OR (CUSTOM PROCEDURE TRAY) ×3 IMPLANT
KIT TURNOVER KIT B (KITS) ×3 IMPLANT
NDL SPNL 18GX3.5 QUINCKE PK (NEEDLE) ×4 IMPLANT
NEEDLE 22X1 1/2 (OR ONLY) (NEEDLE) ×3 IMPLANT
NEEDLE SPNL 18GX3.5 QUINCKE PK (NEEDLE) ×4 IMPLANT
NS IRRIG 1000ML POUR BTL (IV SOLUTION) ×3 IMPLANT
PACK LAMINECTOMY ORTHO (CUSTOM PROCEDURE TRAY) ×3 IMPLANT
PACK UNIVERSAL I (CUSTOM PROCEDURE TRAY) ×3 IMPLANT
PAD ARMBOARD 7.5X6 YLW CONV (MISCELLANEOUS) ×6 IMPLANT
PATTIES SURGICAL .5 X.5 (GAUZE/BANDAGES/DRESSINGS) ×4 IMPLANT
PATTIES SURGICAL .5 X1 (DISPOSABLE) ×2 IMPLANT
SPONGE SURGIFOAM ABS GEL 100 (HEMOSTASIS) ×1 IMPLANT
SPONGE T-LAP 4X18 ~~LOC~~+RFID (SPONGE) ×8 IMPLANT
SURGIFLO W/THROMBIN 8M KIT (HEMOSTASIS) ×1 IMPLANT
SUT BONE WAX W31G (SUTURE) ×3 IMPLANT
SUT MNCRL+ AB 3-0 CT1 36 (SUTURE) ×2 IMPLANT
SUT MONOCRYL AB 3-0 CT1 36IN (SUTURE)
SUT VIC AB 1 CT1 18XCR BRD 8 (SUTURE) ×2 IMPLANT
SUT VIC AB 1 CT1 8-18 (SUTURE) ×4
SUT VIC AB 2-0 CT1 18 (SUTURE) ×3 IMPLANT
SYR BULB IRRIG 60ML STRL (SYRINGE) ×3 IMPLANT
SYR CONTROL 10ML LL (SYRINGE) ×3 IMPLANT
TOWEL GREEN STERILE (TOWEL DISPOSABLE) ×2 IMPLANT
TOWEL GREEN STERILE FF (TOWEL DISPOSABLE) ×3 IMPLANT
WATER STERILE IRR 1000ML POUR (IV SOLUTION) ×3 IMPLANT
YANKAUER SUCT BULB TIP NO VENT (SUCTIONS) IMPLANT

## 2022-06-21 NOTE — Brief Op Note (Signed)
06/21/2022  3:03 PM  PATIENT:  Catherine Jensen  71 y.o. female  PRE-OPERATIVE DIAGNOSIS:  Lumbar spinal stenosis with radiculopathy  POST-OPERATIVE DIAGNOSIS:  Lumbar spinal stenosis with radiculopathy  PROCEDURE:  Procedure(s) with comments: LUMBAR FIVE TO SACRAL ONE DECOMPRESSION (Left) - 2.5 hrs 3 C-Bed  SURGEON:  Surgeon(s) and Role:    Melina Schools, MD - Primary  PHYSICIAN ASSISTANT:   ASSISTANTS: none   ANESTHESIA:   general  EBL:  20 mL   BLOOD ADMINISTERED:none  DRAINS: none   LOCAL MEDICATIONS USED:  MARCAINE     SPECIMEN:  No Specimen  DISPOSITION OF SPECIMEN:  N/A  COUNTS:  YES  TOURNIQUET:  * No tourniquets in log *  DICTATION: .Dragon Dictation  PLAN OF CARE: Admit for overnight observation  PATIENT DISPOSITION:  PACU - hemodynamically stable.

## 2022-06-21 NOTE — Anesthesia Procedure Notes (Signed)
Procedure Name: Intubation Date/Time: 06/21/2022 1:30 PM  Performed by: Minerva Ends, CRNAPre-anesthesia Checklist: Patient identified, Emergency Drugs available, Suction available and Patient being monitored Patient Re-evaluated:Patient Re-evaluated prior to induction Oxygen Delivery Method: Circle system utilized Preoxygenation: Pre-oxygenation with 100% oxygen Induction Type: IV induction Ventilation: Mask ventilation without difficulty Laryngoscope Size: Mac and 3 Grade View: Grade I Tube type: Oral Tube size: 7.0 mm Number of attempts: 1 Airway Equipment and Method: Stylet and Oral airway Placement Confirmation: ETT inserted through vocal cords under direct vision, positive ETCO2 and breath sounds checked- equal and bilateral Secured at: 21 cm Tube secured with: Tape Dental Injury: Teeth and Oropharynx as per pre-operative assessment

## 2022-06-21 NOTE — H&P (Signed)
History:  Amberle presents today with ongoing significant back buttock and radicular left leg pain. Her clinical exam is unchanged. We will plan on moving forward with the left-sided L5-S1 decompression/discectomy as scheduled and discussed. We have again reviewed the risks and benefits and she is expressed an understanding and willingness to move forward with surgery.  Past Medical History:  Diagnosis Date   COPD (chronic obstructive pulmonary disease) ([place])    Endometriosis    FH: lung cancer    H/O blood clots    in legs   History of COVID-19 08/25/2021   Multiple allergies    pt states she took allergy shots x 20 yrs   Tobacco abuse     Allergies  Allergen Reactions   Bee Venom Hives and Swelling    syncope   Codeine Nausea And Vomiting    Dizzy   Musk Oil Fragrance Hives and Swelling    Swelling, syncope   Other Hives, Nausea And Vomiting and Swelling    Clorox- sweating, hives, swelling, nausea, syncope Cornbread- hives, swelling on face, syncope Lysol- swelling, nausea, syncope Bug spray- swelling, nausea, syncope   Shellfish Allergy Other (See Comments)    Patient was allergy tested and told she reacted to shellfish and not to eat.   Walnuts [Nuts] Hives and Swelling    syncope    No current facility-administered medications on file prior to encounter.   Current Outpatient Medications on File Prior to Encounter  Medication Sig Dispense Refill   acetaminophen (TYLENOL) 500 MG tablet Take 1,000 mg by mouth every 6 (six) hours as needed for moderate pain.     Ascorbic Acid (VITA-C PO) Take 1 tablet by mouth every other day.     BIOTIN PO Take 1 tablet by mouth daily.     DENTAGEL 1.1 % GEL dental gel Place 1 Application onto teeth at bedtime.     famotidine (PEPCID) 40 MG tablet Take 40 mg by mouth daily.     ibuprofen (ADVIL) 200 MG tablet Take 600 mg by mouth every 6 (six) hours as needed for moderate pain.     Lysine 1000 MG TABS Take 1 tablet by mouth daily.      Multiple Vitamins-Minerals (MULTIVITAMIN WITH MINERALS) tablet Take 1 tablet by mouth daily.     diphenhydrAMINE (BENADRYL) 25 MG tablet Take 25 mg by mouth every 6 (six) hours as needed for allergies.      Physical Exam: Vitals:   06/21/22 1215  BP: (!) 164/80  Pulse: (!) 101  Resp: 17  Temp: 98 F (36.7 C)  SpO2: 98%   Body mass index is 18.18 kg/m.  Clinical exam: Catherine Jensen is a pleasant individual, who appears younger than their stated age. She is alert and orientated 3. No shortness of breath, chest pain. Abdomen is soft and non-tender, negative loss of bowel and bladder control, no rebound tenderness. Negative: skin lesions abrasions contusions Peripheral pulses: 2+ dorsalis pedis/posterior tibialis pulses bilaterally. LE compartments are: Soft and nontender. Gait pattern: Normal Assistive devices: None Neuro: Positive complaints of neuropathic left leg pain. Positive numbness and dysesthesias diffusely in the left lower extremity. 5/5 motor strength in the lower extremity bilaterally. Negative Babinski test, negative Spurling sign, no clonus. Symmetrical 2+ deep tendon reflexes. Musculoskeletal: No back pain with palpation or range of motion. No SI joint pain.  Imaging: X-rays of the lumbar spine demonstrate multilevel degenerative disc disease but no spondylolisthesis or scoliosis.  Lumbar MRI: completed on 05/02/2022 : No  evidence of fracture or abnormal marrow signal change. Severe left neuroforaminal narrowing and lateral recess stenosis at L5-S1 affecting the exiting L5 and S1 nerve root. Minimal retrolisthesis L4 on 5. Mild central canal stenosis. Mild to moderate left lateral recess stenosis.  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 No evidence of left common peroneal neuropathy. There was no electrodiagnostic evidence of lumbosacral radiculopathy.  A/P: At  this point time I believe the patient's primary source of pain is the foraminal and lateral recess stenosis at L5-S1 along with the peripheral neuropathy. She has had injection therapy, and attempted physical therapy but unfortunately has continued to deteriorate. She is expressed a desire to move forward with surgery. We have had a long discussion about surgical intervention which would be a lumbar decompression. I told her that the goal of the surgery would be to reduce the neuropathic leg pain in the leg. Since she does have evidence of a peripheral neuropathy it is difficult for me to determine the extent of which this will be successful. I did tell her however that her symptoms may be no better or worse following the surgery.  We have also discussed the remaining risks which include: Risks and benefits of decompression/discectomy: Infection, bleeding, death, stroke, paralysis, ongoing or worse pain, need for additional surgery, leak of spinal fluid, adjacent segment degeneration requiring additional surgery, post-operative hematoma formation that can result in neurological compromise and the need for urgent/emergent re-operation. Loss in bowel and bladder control. Injury to major vessels that could result in the need for urgent abdominal surgery to stop bleeding. Risk of deep venous thrombosis (DVT) and the need for additional treatment. Recurrent disc herniation resulting in the need for revision surgery, which could include fusion surgery (utilizing instrumentation such as pedicle screws and intervertebral cages). Additional risk: If instrumentation is used there is a risk of migration, or breakage of that hardware that could require additional surgery.  All of her questions were addressed and she is expressed an understanding of the risks and benefits. At this point time we will obtain preoperative medical clearance from her primary care physician and move forward with surgery which would be an L5-S1  left-sided decompression.

## 2022-06-21 NOTE — Transfer of Care (Signed)
Immediate Anesthesia Transfer of Care Note  Patient: Catherine Jensen  Procedure(s) Performed: LUMBAR FIVE TO SACRAL ONE DECOMPRESSION (Left: Spine Lumbar)  Patient Location: PACU  Anesthesia Type:General  Level of Consciousness: awake, alert  and oriented  Airway & Oxygen Therapy: Patient Spontanous Breathing  Post-op Assessment: Report given to RN and Post -op Vital signs reviewed and stable  Post vital signs: Reviewed and stable  Last Vitals:  Vitals Value Taken Time  BP 161/89 06/21/22 1545  Temp 36.5 C 06/21/22 1500  Pulse 87 06/21/22 1548  Resp 23 06/21/22 1548  SpO2 88 % 06/21/22 1548  Vitals shown include unvalidated device data.  Last Pain:  Vitals:   06/21/22 1500  PainSc: 8       Patients Stated Pain Goal: 3 (76/[id number])  Complications: No notable events documented.

## 2022-06-21 NOTE — Op Note (Signed)
OPERATIVE REPORT  DATE OF SURGERY: 06/21/2022  PATIENT NAME:  Catherine Jensen MRN: [id number] DOB: [birth year]  PCP: Sharion Balloon, FNP  PRE-OPERATIVE DIAGNOSIS: Lumbar spinal stenosis L5-S1 with radiculopathy  POST-OPERATIVE DIAGNOSIS: Same  PROCEDURE:   Left L5 hemilaminectomy with L5-S1 foraminotomy and medial facetectomy  SURGEON:  Melina Schools, MD  PHYSICIAN ASSISTANT: None  ANESTHESIA:   General  EBL: 25 ml   Complications: None  BRIEF HISTORY: Catherine Jensen is a 71 y.o. female who presented to my office with significant back buttock and radicular left leg pain.  Imaging studies demonstrated significant lateral recess and foraminal stenosis L5-S1.  As result of the failure to improve with conservative management and the ongoing radicular leg pain we elected to move forward with surgery.  All appropriate risks, benefits, and alternatives to surgery were discussed and consent was obtained.  PROCEDURE DETAILS: Patient was brought into the operating room and was properly positioned on the operating room table.  After induction with general anesthesia the patient was endotracheally intubated.  A timeout was taken to confirm all important data: including patient, procedure, and the level. Teds, SCD's were applied.   Patient was turned prone onto the Wilson frame and all bony promises well-padded.  The back was then prepped and draped in a standard fashion.  2 needles were placed in the back and an x-ray was taken to for localization of the incision.  The incision site was marked and infiltrated with quarter percent Marcaine with epinephrine.  Midline incision was made and sharp dissection was carried out down to the deep fascia.  Deep fascia was incised and I stripped the paraspinal muscles to expose the entire L5 and is well as the L5-S1 facet complex.  A Penfield 4 was placed underneath the L5 lamina and an x-ray was taken to confirm I was at the appropriate level  A  laminotomy was performed with a double-action Leksell rongeur on the left side.  I then dissected through the central raphae of the ligamentum flavum and resected the ligamentum flavum.  I then proceeded into the lateral recess.  Care was taken with my Penfield 4 to develop a plane between the thecal sac and traversing S1 nerve root and the ligamentum flavum and medial aspect of the facet.  Once I had a clear plane I removed the ligamentum flavum and performed a medial facetectomy.  I then continued to work inferiorly to light could palpate and visualize the medial border of the S1 pedicle.  The S1 nerve root was identified retracted into the foramen and performed a foraminotomy.  I then trimmed the leading edge of the S1 lamina to further decompress left side.  Once this was done I then proceeded superiorly.  A complete hemilaminectomy was performed with a 3 and 2 mm Kerrison rongeur and identify the L5 nerve root.  Once this was visualized I then performed a foraminotomy at L5.  At this point I could clearly visualize the L5 nerve root and it was free and mobile.  I could pass my nerve hook out the L5 foramen and there was no significant compression to the nerve root.  I was also able to palpate the inferior portion of the L5 pedicle.  At this point the lateral recess in both the L5 and S1 foramen were decompressed.  The thecal sac and the L5 and S1 nerve roots were also free from compression.  At this point I irrigated the wound copiously with normal saline and confirmed  hemostasis.  I then placed a thrombin-soaked Gelfoam patty over the laminotomy defect and remove the retractor.  The deep fascia was closed with interrupted #1 Vicryl sutures, then a layer 2-0 Vicryl sutures, and finally 3-0 Monocryl for the skin.  Steri-Strips and a dry dressing were applied and the patient was ultimately extubated and transferred the PACU without incident.  The end of the case all needle sponge counts were correct.  There were  no adverse intraoperative events.  Melina Schools, MD 06/21/2022 2:49 PM

## 2022-06-21 NOTE — Progress Notes (Deleted)
Patient transported to her vehicle via wheelchair by NT for discharge home; moves all extremities well; in no acute distress nor complaints of pain nor discomfort; incision on her lower back with Aquacel dressing and is clean, dry and intact; room was checked for all her belongings; discharge instructions concerning her medications, incision care, follow up appointment and when to call the doctor as needed were all discussed with patient and her daughter who does sign language for her by RN and both expressed understanding on the instructions given.

## 2022-06-21 NOTE — Anesthesia Preprocedure Evaluation (Signed)
Anesthesia Evaluation  Patient identified by MRN, date of birth, ID band Patient awake    Reviewed: Allergy & Precautions, NPO status , Patient's Chart, lab work & pertinent test results  History of Anesthesia Complications Negative for: history of anesthetic complications  Airway Mallampati: II  TM Distance: >3 FB Neck ROM: Full    Dental  (+) Dental Advisory Given, Missing,    Pulmonary COPD, former smoker,    breath sounds clear to auscultation       Cardiovascular negative cardio ROS   Rhythm:Regular     Neuro/Psych Lumbar spinal stenosis with radiculopathy  Neuromuscular disease negative psych ROS   GI/Hepatic Neg liver ROS, GERD  Controlled,  Endo/Other  negative endocrine ROS  Renal/GU negative Renal ROS     Musculoskeletal negative musculoskeletal ROS (+)   Abdominal   Peds  Hematology negative hematology ROS (+)   Anesthesia Other Findings   Reproductive/Obstetrics                             Anesthesia Physical Anesthesia Plan  ASA: 2  Anesthesia Plan: General   Post-op Pain Management: Ofirmev IV (intra-op)* and Toradol IV (intra-op)*   Induction: Intravenous  PONV Risk Score and Plan: 3 and Ondansetron, Dexamethasone and Propofol infusion  Airway Management Planned: Oral ETT  Additional Equipment: None  Intra-op Plan:   Post-operative Plan: Extubation in OR  Informed Consent: I have reviewed the patients History and Physical, chart, labs and discussed the procedure including the risks, benefits and alternatives for the proposed anesthesia with the patient or authorized representative who has indicated his/her understanding and acceptance.     Dental advisory given  Plan Discussed with: CRNA  Anesthesia Plan Comments:         Anesthesia Quick Evaluation

## 2022-06-21 NOTE — OR Nursing (Signed)
Level L5-S1 verified by radiologist, Dr Adora Fridge

## 2022-06-21 NOTE — Discharge Instructions (Signed)

## 2022-06-22 ENCOUNTER — Encounter (HOSPITAL_COMMUNITY): Payer: Self-pay | Admitting: Orthopedic Surgery

## 2022-06-22 DIAGNOSIS — J449 Chronic obstructive pulmonary disease, unspecified: Secondary | ICD-10-CM | POA: Diagnosis not present

## 2022-06-22 DIAGNOSIS — M5417 Radiculopathy, lumbosacral region: Secondary | ICD-10-CM | POA: Diagnosis not present

## 2022-06-22 DIAGNOSIS — Z79899 Other long term (current) drug therapy: Secondary | ICD-10-CM | POA: Diagnosis not present

## 2022-06-22 DIAGNOSIS — Z87891 Personal history of nicotine dependence: Secondary | ICD-10-CM | POA: Diagnosis not present

## 2022-06-22 DIAGNOSIS — M4807 Spinal stenosis, lumbosacral region: Secondary | ICD-10-CM | POA: Diagnosis not present

## 2022-06-22 DIAGNOSIS — Z8616 Personal history of COVID-19: Secondary | ICD-10-CM | POA: Diagnosis not present

## 2022-06-22 NOTE — Progress Notes (Signed)
    Subjective: Procedure(s) (LRB): LUMBAR FIVE TO SACRAL ONE DECOMPRESSION (Left) 1 Day Post-Op  Patient reports pain as 1 on 0-10 scale.  Reports none leg pain reports incisional back pain   Positive void Negative bowel movement Positive flatus Negative chest pain or shortness of breath  Objective: Vital signs in last 24 hours: Temp:  [97.7 F (36.5 C)-98.2 F (36.8 C)] 97.8 F (36.6 C) (07/28 0723) Pulse Rate:  [62-101] 94 (07/28 0723) Resp:  [13-20] 18 (07/28 0723) BP: (121-164)/(58-89) 132/76 (07/28 0723) SpO2:  [92 %-100 %] 100 % (07/28 0723) Weight:  [40.8 kg] 40.8 kg (07/27 1215)  Intake/Output from previous day: 07/27 0701 - 07/28 0700 In: 1000 [I.V.:900; IV Piggyback:100] Out: 20 [Blood:20]  Labs: No results for input(s): "WBC", "RBC", "HCT", "PLT" in the last 72 hours. No results for input(s): "NA", "K", "CL", "CO2", "BUN", "CREATININE", "GLUCOSE", "CALCIUM" in the last 72 hours. No results for input(s): "LABPT", "INR" in the last 72 hours.  Physical Exam: Neurologically intact ABD soft Intact pulses distally Dorsiflexion/Plantar flexion intact Incision: dressing C/D/I and no drainage Compartment soft Body mass index is 18.18 kg/m.   Assessment/Plan: Patient stable  Continue mobilization with physical therapy Continue care  Patient doing exceptionally well.  Preoperative radicular leg pain has resolved.  Only complaint is incisional pain/discomfort.  Plan on discharge to home.  She will follow-up with me in 2 weeks  Melina Schools, MD [facility] [phone]

## 2022-06-22 NOTE — Progress Notes (Signed)
PT Cancellation Note  Patient Details Name: Catherine Jensen MRN: [id number] DOB: 1951/09/06   Cancelled Treatment:    Reason Eval/Treat Not Completed: PT screened, no needs identified, will sign off.  Discussed pt case with OT who reports pt is currently mobilizing at an independent level and does not require a formal PT evaluation at this time. PT signing off. If needs change, please reconsult.     Thelma Comp 06/22/2022, 10:48 AM  Rolinda Roan, PT, DPT Acute Rehabilitation Services Secure Chat Preferred Office: [phone]

## 2022-06-22 NOTE — Anesthesia Postprocedure Evaluation (Signed)
Anesthesia Post Note  Patient: Catherine Jensen  Procedure(s) Performed: LUMBAR FIVE TO SACRAL ONE DECOMPRESSION (Left: Spine Lumbar)     Patient location during evaluation: PACU Anesthesia Type: General Level of consciousness: awake and alert Pain management: pain level controlled Vital Signs Assessment: post-procedure vital signs reviewed and stable Respiratory status: spontaneous breathing, nonlabored ventilation and respiratory function stable Cardiovascular status: blood pressure returned to baseline and stable Postop Assessment: no apparent nausea or vomiting Anesthetic complications: no   No notable events documented.  Last Vitals:  Vitals:   06/22/22 0327 06/22/22 0723  BP: (!) 121/58 132/76  Pulse: 80 94  Resp: 16 18  Temp: 36.6 C 36.6 C  SpO2: 97% 100%    Last Pain:  Vitals:   06/22/22 0723  TempSrc:   PainSc: 3    Pain Goal: Patients Stated Pain Goal: 3 (06/21/22 2021)                 Allister Lessley

## 2022-06-22 NOTE — Discharge Summary (Signed)
Patient ID: Catherine Jensen MRN: [id number] DOB/AGE: 02/15/1951 71 y.o.  Admit date: 06/21/2022 Discharge date: 06/22/2022  Admission Diagnoses:  Principal Problem:   Status post lumbar spine operative procedure for decompression of spinal cord   Discharge Diagnoses:  Principal Problem:   Status post lumbar spine operative procedure for decompression of spinal cord  status post Procedure(s): LUMBAR FIVE TO SACRAL ONE DECOMPRESSION  Past Medical History:  Diagnosis Date   COPD (chronic obstructive pulmonary disease) ([place])    Endometriosis    FH: lung cancer    H/O blood clots    in legs   History of COVID-19 08/25/2021   Multiple allergies    pt states she took allergy shots x 20 yrs   Tobacco abuse     Surgeries: Procedure(s): LUMBAR FIVE TO SACRAL ONE DECOMPRESSION on 06/21/2022   Consultants:   Discharged Condition: Improved  Hospital Course: Catherine Jensen is an 71 y.o. female who was admitted 06/21/2022 for operative treatment of Status post lumbar spine operative procedure for decompression of spinal cord. Patient failed conservative treatments (please see the history and physical for the specifics) and had severe unremitting pain that affects sleep, daily activities and work/hobbies. After pre-op clearance, the patient was taken to the operating room on 06/21/2022 and underwent  Procedure(s): LUMBAR FIVE TO SACRAL ONE DECOMPRESSION.    Patient was given perioperative antibiotics:  Anti-infectives (From admission, onward)    Start     Dose/Rate Route Frequency Ordered Stop   06/21/22 2000  ceFAZolin (ANCEF) IVPB 1 g/50 mL premix        1 g 100 mL/hr over 30 Minutes Intravenous Every 8 hours 06/21/22 1608 06/22/22 0403   06/21/22 1219  ceFAZolin (ANCEF) 2-4 GM/100ML-% IVPB       Note to Pharmacy: Rocky Morel D: cabinet override      06/21/22 1219 06/21/22 1338   06/21/22 1212  ceFAZolin (ANCEF) IVPB 2g/100 mL premix        2 g 200 mL/hr over 30 Minutes  Intravenous 30 min pre-op 06/21/22 1212 06/21/22 1337        Patient was given sequential compression devices and early ambulation to prevent DVT.   Patient benefited maximally from hospital stay and there were no complications. At the time of discharge, the patient was urinating/moving their bowels without difficulty, tolerating a regular diet, pain is controlled with oral pain medications and they have been cleared by PT/OT.   Recent vital signs: Patient Vitals for the past 24 hrs:  BP Temp Temp src Pulse Resp SpO2 Height Weight  06/22/22 0723 132/76 97.8 F (36.6 C) -- 94 18 100 % -- --  06/22/22 0327 (!) 121/58 97.9 F (36.6 C) Oral 80 16 97 % -- --  06/21/22 2311 138/65 97.8 F (36.6 C) Oral 65 15 97 % -- --  06/21/22 1946 (!) 144/65 98.2 F (36.8 C) Oral 96 16 95 % -- --  06/21/22 1614 (!) 161/75 97.7 F (36.5 C) Oral 67 19 99 % -- --  06/21/22 1600 (!) 157/74 97.7 F (36.5 C) -- 86 14 92 % -- --  06/21/22 1545 (!) 161/89 -- -- 89 16 93 % -- --  06/21/22 1530 (!) 155/80 -- -- 62 20 96 % -- --  06/21/22 1515 (!) 152/65 -- -- 74 19 97 % -- --  06/21/22 1500 (!) 156/71 97.7 F (36.5 C) -- 78 13 96 % -- --  06/21/22 1215 (!) 164/80 98 F (36.7 C) -- (!)  101 17 98 % 4\' 11"  (1.499 m) 40.8 kg     Recent laboratory studies: No results for input(s): "WBC", "HGB", "HCT", "PLT", "NA", "K", "CL", "CO2", "BUN", "CREATININE", "GLUCOSE", "INR", "CALCIUM" in the last 72 hours.  Invalid input(s): "PT", "2"   Discharge Medications:   Allergies as of 06/22/2022       Reactions   Bee Venom Hives, Swelling   syncope   Codeine Nausea And Vomiting   Dizzy   Musk Oil Fragrance Hives, Swelling   Swelling, syncope   Other Hives, Nausea And Vomiting, Swelling   Clorox- sweating, hives, swelling, nausea, syncope Cornbread- hives, swelling on face, syncope Lysol- swelling, nausea, syncope Bug spray- swelling, nausea, syncope   Shellfish Allergy Other (See Comments)   Patient was allergy  tested and told she reacted to shellfish and not to eat.   Walnuts [nuts] Hives, Swelling   syncope        Medication List     STOP taking these medications    acetaminophen 500 MG tablet Commonly known as: TYLENOL   ibuprofen 200 MG tablet Commonly known as: ADVIL   multivitamin with minerals tablet       TAKE these medications    BIOTIN PO Take 1 tablet by mouth daily.   DentaGel 1.1 % Gel dental gel Generic drug: sodium fluoride Place 1 Application onto teeth at bedtime.   diphenhydrAMINE 25 MG tablet Commonly known as: BENADRYL Take 25 mg by mouth every 6 (six) hours as needed for allergies.   famotidine 40 MG tablet Commonly known as: PEPCID Take 40 mg by mouth daily.   Lysine 1000 MG Tabs Take 1 tablet by mouth daily.   methocarbamol 500 MG tablet Commonly known as: ROBAXIN Take 1 tablet (500 mg total) by mouth every 8 (eight) hours as needed for up to 5 days for muscle spasms.   ondansetron 4 MG tablet Commonly known as: Zofran Take 1 tablet (4 mg total) by mouth every 8 (eight) hours as needed for nausea or vomiting.   oxyCODONE-acetaminophen 10-325 MG tablet Commonly known as: Percocet Take 1 tablet by mouth every 6 (six) hours as needed for up to 5 days for pain.   VITA-C PO Take 1 tablet by mouth every other day.        Diagnostic Studies: DG Lumbar Spine 1 View  Result Date: 06/21/2022 CLINICAL DATA:  L5-S1 decompression. EXAM: LUMBAR SPINE - 1 VIEW COMPARISON:  Intraoperative images of the same day. FINDINGS: Surgical probes across the L5-S1 disc space. Soft tissue spreaders are at the disc space and project over the L5 spinous process. IMPRESSION: Intraoperative localization of L5-S1. Electronically Signed   By: San Morelle M.D.   On: 06/21/2022 14:28   DG Lumbar Spine 2-3 Views  Result Date: 06/21/2022 CLINICAL DATA:  Portable cross-table lateral view done for localization in the operating room EXAM: LUMBAR SPINE - 2-3 VIEW  COMPARISON:  04/02/2022 FINDINGS: Two cross-table lateral views of the lumbar spine are submitted for review. In the first image, there are 2 metallic densities 1 pointing to the posterior elements of L5 vertebra and the second 1 pointing to the posterior elements of the S1 vertebra. In the second image, there is a localizing metallic density with its tip posterior to the L5-S1 disc space. Imaging findings were relayed to the operating room by telephone call at 2 p.m. on 06/21/2022. there is disc space narrowing at multiple levels and lumbar spine, more so at the L4-L5 and L5-S1 levels.  IMPRESSION: In the second radiograph, tip of localizing probe is overlying the posterior elements at L5-S1 level. Electronically Signed   By: Elmer Picker M.D.   On: 06/21/2022 14:03    Discharge Instructions     Incentive spirometry RT   Complete by: As directed         Follow-up Information     Melina Schools, MD Follow up in 2 week(s).   Specialty: Orthopedic Surgery Why: If symptoms worsen, For suture removal, For wound re-check Contact information: [address] [place] [address] [phone]                 Discharge Plan:  discharge to home  Disposition: Patient's preoperative neuropathic leg pain has resolved.  She states she can ambulate without the severe radicular leg pain and she is very pleased with the results of surgery.  We have gone over postoperative restrictions including modified behavior and not doing a lot of strenuous activities.  I have encouraged her to walk, as well as gentle lower extremity stretching exercises for circulation.  She will be discharged with pain medication, antinausea medication, and a muscle relaxer.  Patient has instructions and will follow-up with me in 2 weeks.    Signed: Dahlia Bailiff for Dr. Melina Schools [facility] [phone] 06/22/2022, 7:46 AM

## 2022-06-22 NOTE — Plan of Care (Signed)

## 2022-06-22 NOTE — Evaluation (Signed)
Occupational Therapy Evaluation Patient Details Name: Catherine Jensen MRN: [id number] DOB: [birth year] Today's Date: 06/22/2022   History of Present Illness 34 female s/p 7/27 L1- S1 decompression PMH COPD endometriosis, Lung CA, blood clots legs,Tobacco abuse,   Clinical Impression   PT admitted with L1-S1 decompression. Pt currently with functional limitiations due to the deficits listed below (see OT problem list). Pt educated on all back precautions for adls. Pt with good return demo including stair transfers. Pt advised to progress slowly over the next 2 weeks. Pt reports cleaning daily including sweeping. Pt advised to not complete this task at this rate/ pace upon d/c.  Pt will benefit from skilled OT to increase their independence and safety with adls and balance to allow discharge home. Son will pick pt up today. Pt expressed not feeling comfortable riding with spouse as he has had 3 x CVA and she only wants to ride with son.        Recommendations for follow up therapy are one component of a multi-disciplinary discharge planning process, led by the attending physician.  Recommendations may be updated based on patient status, additional functional criteria and insurance authorization.   Follow Up Recommendations  No OT follow up    Assistance Recommended at Discharge PRN  Patient can return home with the following Assist for transportation    Functional Status Assessment  Patient has had a recent decline in their functional status and demonstrates the ability to make significant improvements in function in a reasonable and predictable amount of time.  Equipment Recommendations  None recommended by OT    Recommendations for Other Services       Precautions / Restrictions Precautions Precautions: Back Precaution Comments: back precaustion provided and reviewed for adls. Required Braces or Orthoses: Spinal Brace Spinal Brace: Lumbar corset;Applied in sitting position       Mobility Bed Mobility Overal bed mobility: Independent             General bed mobility comments: repeating bed mobility x3 to reinforce the proper positioning    Transfers Overall transfer level: Independent                        Balance                                           ADL either performed or assessed with clinical judgement   ADL Overall ADL's : Independent                                       General ADL Comments: dressing this session with back precautiosn with figure 4. pt able to don doff brace with fit adjusted and advised to keep brace lower for incision comfort. pt reports no further questions or concerns     Vision Baseline Vision/History: 0 No visual deficits Patient Visual Report: No change from baseline Vision Assessment?: No apparent visual deficits     Perception     Praxis      Pertinent Vitals/Pain Pain Assessment Pain Assessment: No/denies pain     Hand Dominance Right   Extremity/Trunk Assessment Upper Extremity Assessment Upper Extremity Assessment: Overall WFL for tasks assessed   Lower Extremity Assessment Lower Extremity Assessment: Overall WFL for tasks assessed   Cervical /  Trunk Assessment Cervical / Trunk Assessment: Back Surgery   Communication Communication Communication: No difficulties   Cognition Arousal/Alertness: Awake/alert Behavior During Therapy: WFL for tasks assessed/performed Overall Cognitive Status: Within Functional Limits for tasks assessed                                 General Comments: reviewed back precautions with adls. and repeating task to reinforce     General Comments  dressing dry and intact at this time. pt educated to wash around dressing and use clean linen each shower    Exercises Exercises: Other exercises Other Exercises Other Exercises: pt completed stairs to simulate in the house and the basement for the dog    Shoulder Instructions      Home Living Family/patient expects to be discharged to:: Private residence Living Arrangements: Spouse/significant other Available Help at Discharge: Family;Available PRN/intermittently Type of Home: House Home Access: Stairs to enter [employer] of Steps: 1 Entrance Stairs-Rails: None Home Layout: One level;Two level;Able to live on main level with bedroom/bathroom Alternate Level Stairs-Number of Steps: basement has full fligiht of stairs and goes down them to let the dog out and in Alternate Level Stairs-Rails: Right Bathroom Shower/Tub: Tub/shower unit   Bathroom Toilet: Standard     Home Equipment: Hand held shower head;Grab bars - tub/shower;Grab bars - toilet (grab bar is outside the shower door)   Additional Comments: pt plays golf 3 days week and motivated to return to this task. pt cleans the floor daily and even sweeps the basement area due to the dog daily. Dog only stays in the basement ( son dog)      Prior Functioning/Environment Prior Level of Function : Independent/Modified Independent;Driving             Mobility Comments: reports prior limited activity due to L LE pain          OT Problem List:        OT Treatment/Interventions:      OT Goals(Current goals can be found in the care plan section) Acute Rehab OT Goals Patient Stated Goal: to use my new 3wood i just bought and play golf . its just so much fun Potential to Achieve Goals: Good  OT Frequency:      Co-evaluation              AM-PAC OT "6 Clicks" Daily Activity     Outcome Measure Help from another person eating meals?: None Help from another person taking care of personal grooming?: None Help from another person toileting, which includes using toliet, bedpan, or urinal?: None Help from another person bathing (including washing, rinsing, drying)?: None Help from another person to put on and taking off regular upper body clothing?: None Help  from another person to put on and taking off regular lower body clothing?: None 6 Click Score: 24   End of Session Equipment Utilized During Treatment: Back brace Nurse Communication: Mobility status;Precautions  Activity Tolerance: Patient tolerated treatment well Patient left: in bed;with call bell/phone within reach  OT Visit Diagnosis: Unsteadiness on feet (R26.81)                Time: 7169-6789 OT Time Calculation (min): 21 min Charges:  OT General Charges $OT Visit: 1 Visit OT Evaluation $OT Eval Moderate Complexity: 1 Mod   Brynn, OTR/L  Acute Rehabilitation Services Office: [phone] .   Jeri Modena 06/22/2022, 10:16 AM

## 2022-06-25 ENCOUNTER — Other Ambulatory Visit: Payer: Self-pay

## 2022-07-05 ENCOUNTER — Encounter: Payer: Self-pay | Admitting: Physical Therapy

## 2022-07-05 ENCOUNTER — Other Ambulatory Visit: Payer: Self-pay

## 2022-07-05 ENCOUNTER — Ambulatory Visit: Payer: Medicare Other | Attending: Orthopedic Surgery | Admitting: Physical Therapy

## 2022-07-05 DIAGNOSIS — M79662 Pain in left lower leg: Secondary | ICD-10-CM | POA: Insufficient documentation

## 2022-07-05 NOTE — Therapy (Signed)
OUTPATIENT PHYSICAL THERAPY THORACOLUMBAR EVALUATION   Patient Name: Catherine Jensen MRN: [id number] DOB:1951/07/24, 71 y.o., female Today's Date: 07/05/2022   PT End of Session - 07/05/22 1329     Visit Number 1    Number of Visits 8    Date for PT Re-Evaluation 08/16/22    Authorization Type FOTO AT LEAST EVERY 5TH VISIT.  PROGRESS NOTE AT 10TH VISIT.  KX MODIFIER AFTER 15 VISITS.    PT Start Time 0100    PT Stop Time 0126    PT Time Calculation (min) 26 min    Activity Tolerance Patient tolerated treatment well    Behavior During Therapy WFL for tasks assessed/performed             Past Medical History:  Diagnosis Date   COPD (chronic obstructive pulmonary disease) ([place])    Endometriosis    FH: lung cancer    H/O blood clots    in legs   History of COVID-19 08/25/2021   Multiple allergies    pt states she took allergy shots x 20 yrs   Tobacco abuse    Past Surgical History:  Procedure Laterality Date   ABLATION ON ENDOMETRIOSIS     LUMBAR LAMINECTOMY/DECOMPRESSION MICRODISCECTOMY Left 06/21/2022   Procedure: LUMBAR FIVE TO SACRAL ONE DECOMPRESSION;  Surgeon: Melina Schools, MD;  Location: [facility];  Service: Orthopedics;  Laterality: Left;  2.5 hrs 3 C-Bed   SKIN LESION EXCISION     TOTAL ABDOMINAL HYSTERECTOMY  02/1990   TUBAL LIGATION     TUMOR REMOVAL     uterus   Patient Active Problem List   Diagnosis Date Noted   Status post lumbar spine operative procedure for decompression of spinal cord 06/21/2022   Hypercalcemia 02/10/2021   Osteopenia 12/26/2020   Personal history of DVT (deep vein thrombosis) 04/08/2014    Class: History of   COPD (chronic obstructive pulmonary disease) ([place]) 05/22/2011      REFERRING PROVIDER: Melina Schools MD  REFERRING DIAG: Left L5-S1 decompression.  Rationale for Evaluation and Treatment Rehabilitation  THERAPY DIAG:  Pain in left lower leg  ONSET DATE: 06/21/22 (surgery date).  SUBJECTIVE:                                                                                                                                                                                            SUBJECTIVE STATEMENT: The patient presents to the clinic s/p left L5-S1 decompression performed on 06/21/22.  She is compliant to wearing her LSO.  She is pleased with her surgical outcome thus far.  She reports some soreness on/off in her left lower  leg but nothing compared to prior to surgery.  She is walking short distances at home for exercise.  She loves to [place] and would like to get back to that. PERTINENT HISTORY:  Bee sting allergy, H/o left LE pain, COPD.    PAIN:  Are you having pain? No   PRECAUTIONS: Other: Per lumbar decompression protocol.    WEIGHT BEARING RESTRICTIONS No  FALLS:  Has patient fallen in last 6 months? No  LIVING ENVIRONMENT: Lives with: lives with their family and lives with their spouse Lives in: House/apartment Stairs: Yes. Has following equipment at home: None  OCCUPATION: Retired.  PLOF: Independent  PATIENT GOALS Return to playing Golf.   OBJECTIVE:   PATIENT SURVEYS:  FOTO Complete.   POSTURE: No Significant postural limitations  PALPATION: No palpable pain.  Post-surgical dressing intact in lumbar region.  LOWER EXTREMITY ROM:     In supine:  Normal bilateral hip flexion.   LOWER EXTREMITY MMT:    Normal bilateral knee and ankle strength.  LUMBAR SPECIAL TESTS:  Equal leg lengths.  Normal bilateral LE DTR's.  GAIT: WNL with LSO donned.   ASSESSMENT:  CLINICAL IMPRESSION: The patient presents to OPPT s/p L5-S1 decompression performed on 06/21/22.  She is doing very well and is pleased with her surgical outcome thus far.  She is compliant to wearing her LSO.  She has some on/off soreness in her left lower leg but otherwise is without compliant.  Bilateral LE DTR's are normal.  Bilateral knee and ankle strength is normal.  Her post-surgical dressing is intact.   Patient expected to do very well with progression through lumbar decompression protocol.  OBJECTIVE IMPAIRMENTS decreased activity tolerance.   ACTIVITY LIMITATIONS  Patient not to bend, extend or twist.  PARTICIPATION LIMITATIONS: cleaning, laundry, and yard work  PERSONAL FACTORS Time since onset of injury/illness/exacerbation are also affecting patient's functional outcome.   REHAB POTENTIAL: Excellent  CLINICAL DECISION MAKING: Stable/uncomplicated  EVALUATION COMPLEXITY: Low   GOALS:  LONG TERM GOALS: Target date: 08/16/2022  Ind with HEP. Baseline:  Goal status: INITIAL  2.  Perform ADL's with pain not > 2/10. Baseline:  Goal status: INITIAL   PLAN: PT FREQUENCY: 2x/week  PT DURATION: 4 weeks  PLANNED INTERVENTIONS: Therapeutic exercises, Therapeutic activity, Neuromuscular re-education, Patient/Family education, Self Care, Electrical stimulation, and Manual therapy.  PLAN FOR NEXT SESSION: Progress per lumbar decompression protocol.     Callaway Hardigree, [place], PT 07/05/2022, 1:34 PM

## 2022-07-16 ENCOUNTER — Encounter: Payer: Medicare Other | Admitting: Physical Therapy

## 2022-08-15 ENCOUNTER — Ambulatory Visit: Payer: Medicare Other | Admitting: Family

## 2022-08-17 ENCOUNTER — Ambulatory Visit: Payer: Medicare Other | Admitting: Family

## 2022-08-23 ENCOUNTER — Ambulatory Visit (INDEPENDENT_AMBULATORY_CARE_PROVIDER_SITE_OTHER): Payer: Medicare Other | Admitting: Family

## 2022-08-23 ENCOUNTER — Encounter: Payer: Self-pay | Admitting: Family

## 2022-08-23 VITALS — BP 130/71 | HR 94 | Temp 97.5°F | Ht 59.0 in | Wt 89.4 lb

## 2022-08-23 DIAGNOSIS — J449 Chronic obstructive pulmonary disease, unspecified: Secondary | ICD-10-CM | POA: Diagnosis not present

## 2022-08-23 DIAGNOSIS — Z23 Encounter for immunization: Secondary | ICD-10-CM

## 2022-08-23 DIAGNOSIS — Z9889 Other specified postprocedural states: Secondary | ICD-10-CM | POA: Diagnosis not present

## 2022-08-23 DIAGNOSIS — R6889 Other general symptoms and signs: Secondary | ICD-10-CM | POA: Diagnosis not present

## 2022-08-23 DIAGNOSIS — M8580 Other specified disorders of bone density and structure, unspecified site: Secondary | ICD-10-CM

## 2022-08-23 DIAGNOSIS — Z0000 Encounter for general adult medical examination without abnormal findings: Secondary | ICD-10-CM | POA: Diagnosis not present

## 2022-08-23 DIAGNOSIS — Z0001 Encounter for general adult medical examination with abnormal findings: Secondary | ICD-10-CM | POA: Diagnosis not present

## 2022-08-23 NOTE — Progress Notes (Signed)
Subjective:    Patient ID: Catherine Jensen, female    DOB: December 21, 1950, 71 y.o.   MRN: [id number]  Chief Complaint  Patient presents with   Medical Management of Chronic Issues    HPI Pt presents to the office today for chronic follow up. Pt currently not taking any prescription medications, but does take several vitamins. Pt denies any headache, palpitations, SOB, or edema at this time.   She had back surgery on 06/21/22 and has been doing well. Reports her pain is 1 out 10. Eager to get back playing golf.    She has COPD, but does not take any inhalers. She quit smoking about 8-9 years ago. Reports she has SOB when walking up hills. She is walking 2-3 miles a day.    Her last lab work showed hypercalcemia, but was cleared by Hematologists. Had a negative CT chest.   She has osteopenia, but has stopped her calcium and vit D.       Review of Systems  All other systems reviewed and are negative.   Family History  Problem Relation Age of Onset   Heart attack Brother    Diabetes Brother    Kidney failure Brother        49   Lung cancer Sister        w/ mets to brain   Asthma Sister    Crohn's disease Sister    Cancer Sister        lung cancer   Heart disease Mother    Diabetes Mother    Social History   Socioeconomic History   Marital status: Married    Spouse name: Catherine Jensen   Number of children: 1   Years of education: 12   Highest education level: 12th grade  Occupational History   Occupation: Armed forces training and education officer: [employer]    Comment: exposed to [employer] @ work  Tobacco Use   Smoking status: Former    Packs/day: 0.50    Years: 21.00    Total pack years: 10.50    Types: Cigarettes    Quit date: 11/26/2008    Years since quitting: 13.7   Smokeless tobacco: Never  Vaping Use   Vaping Use: Never used  Substance and Sexual Activity   Alcohol use: No   Drug use: No   Sexual activity: Not Currently    Partners: Male    Birth control/protection: Surgical     Comment: hysterectomy  Other Topics Concern   Not on file  Social History Narrative   One bio child - 2 step children   Still very active and independent   Enjoys playing golf   Social Determinants of Health   Financial Resource Strain: Low Risk  (11/22/2021)   Overall Financial Resource Strain (CARDIA)    Difficulty of Paying Living Expenses: Not hard at all  Food Insecurity: No Food Insecurity (11/22/2021)   Hunger Vital Sign    Worried About Running Out of Food in the Last Year: Never true    [place] in the Last Year: Never true  Transportation Needs: No Transportation Needs (11/22/2021)   PRAPARE - Hydrologist (Medical): No    Lack of Transportation (Non-Medical): No  Physical Activity: Insufficiently Active (11/22/2021)   Exercise Vital Sign    Days of Exercise per Week: 7 days    Minutes of Exercise per Session: 20 min  Stress: No Stress Concern Present (11/22/2021)   [place]  [employer] - Occupational Stress Questionnaire    Feeling of Stress : Not at all  Social Connections: Moderately Integrated (11/22/2021)   Social Connection and Isolation Panel [NHANES]    Frequency of Communication with Friends and Family: More than three times a week    Frequency of Social Gatherings with Friends and Family: More than three times a week    Attends Religious Services: Never    Marine scientist or Organizations: Yes    Attends Music therapist: More than 4 times per year    Marital Status: Married       Objective:   Physical Exam Vitals reviewed.  Constitutional:      General: She is not in acute distress.    Appearance: She is well-developed.  HENT:     Head: Normocephalic and atraumatic.     Right Ear: Tympanic membrane normal.     Left Ear: Tympanic membrane normal.  Eyes:     Pupils: Pupils are equal, round, and reactive to light.  Neck:     Thyroid: No thyromegaly.  Cardiovascular:      Rate and Rhythm: Normal rate and regular rhythm.     Heart sounds: Normal heart sounds. No murmur heard. Pulmonary:     Effort: Pulmonary effort is normal. No respiratory distress.     Breath sounds: Normal breath sounds. No wheezing.  Abdominal:     General: Bowel sounds are normal. There is no distension.     Palpations: Abdomen is soft.     Tenderness: There is no abdominal tenderness.  Musculoskeletal:        General: No tenderness. Normal range of motion.     Cervical back: Normal range of motion and neck supple.  Skin:    General: Skin is warm and dry.  Neurological:     Mental Status: She is alert and oriented to person, place, and time.     Cranial Nerves: No cranial nerve deficit.     Deep Tendon Reflexes: Reflexes are normal and symmetric.  Psychiatric:        Behavior: Behavior normal.        Thought Content: Thought content normal.        Judgment: Judgment normal.       BP 130/71   Pulse 94   Temp (!) 97.5 F (36.4 C) (Temporal)   Ht $R'4\' 11"'Hb$  (1.499 m)   Wt 89 lb 6.4 oz (40.6 kg)   LMP 02/24/1990 Comment: [facility] does pap,Catherine Jensen CNM  SpO2 96%   BMI 18.06 kg/m      Assessment & Plan:  Catherine Jensen comes in today with chief complaint of Medical Management of Chronic Issues   Diagnosis and orders addressed:  1. Annual physical exam - CMP14+EGFR - CBC with Differential/Platelet - TSH  2. Chronic obstructive pulmonary disease, unspecified COPD type ([place])  - CMP14+EGFR - CBC with Differential/Platelet  3. Osteopenia, unspecified location  - CMP14+EGFR - CBC with Differential/Platelet  4. Status post lumbar spine operative procedure for decompression of spinal cord - CMP14+EGFR - CBC with Differential/Platelet   Labs pending Health Maintenance reviewed Diet and exercise encouraged  Follow up plan: 1 year    [name], FNP

## 2022-08-23 NOTE — Patient Instructions (Signed)
Health Maintenance After Age 70 After age 70, you are at a higher risk for certain long-term diseases and infections as well as injuries from falls. Falls are a major cause of broken bones and head injuries in people who are older than age 70. Getting regular preventive care can help to keep you healthy and well. Preventive care includes getting regular testing and making lifestyle changes as recommended by your health care provider. Talk with your health care provider about: Which screenings and tests you should have. A screening is a test that checks for a disease when you have no symptoms. A diet and exercise plan that is right for you. What should I know about screenings and tests to prevent falls? Screening and testing are the best ways to find a health problem early. Early diagnosis and treatment give you the best chance of managing medical conditions that are common after age 70. Certain conditions and lifestyle choices may make you more likely to have a fall. Your health care provider may recommend: Regular vision checks. Poor vision and conditions such as cataracts can make you more likely to have a fall. If you wear glasses, make sure to get your prescription updated if your vision changes. Medicine review. Work with your health care provider to regularly review all of the medicines you are taking, including over-the-counter medicines. Ask your health care provider about any side effects that may make you more likely to have a fall. Tell your health care provider if any medicines that you take make you feel dizzy or sleepy. Strength and balance checks. Your health care provider may recommend certain tests to check your strength and balance while standing, walking, or changing positions. Foot health exam. Foot pain and numbness, as well as not wearing proper footwear, can make you more likely to have a fall. Screenings, including: Osteoporosis screening. Osteoporosis is a condition that causes  the bones to get weaker and break more easily. Blood pressure screening. Blood pressure changes and medicines to control blood pressure can make you feel dizzy. Depression screening. You may be more likely to have a fall if you have a fear of falling, feel depressed, or feel unable to do activities that you used to do. Alcohol use screening. Using too much alcohol can affect your balance and may make you more likely to have a fall. Follow these instructions at home: Lifestyle Do not drink alcohol if: Your health care provider tells you not to drink. If you drink alcohol: Limit how much you have to: 0-1 drink a day for women. 0-2 drinks a day for men. Know how much alcohol is in your drink. In the [place] one drink equals one 12 oz bottle of beer (355 mL), one 5 oz glass of wine (148 mL), or one 1 oz glass of hard liquor (44 mL). Do not use any products that contain nicotine or tobacco. These products include cigarettes, chewing tobacco, and vaping devices, such as e-cigarettes. If you need help quitting, ask your health care provider. Activity  Follow a regular exercise program to stay fit. This will help you maintain your balance. Ask your health care provider what types of exercise are appropriate for you. If you need a cane or walker, use it as recommended by your health care provider. Wear supportive shoes that have nonskid soles. Safety  Remove any tripping hazards, such as rugs, cords, and clutter. Install safety equipment such as grab bars in bathrooms and safety rails on stairs. Keep rooms and walkways   well-lit. General instructions Talk with your health care provider about your risks for falling. Tell your health care provider if: You fall. Be sure to tell your health care provider about all falls, even ones that seem minor. You feel dizzy, tiredness (fatigue), or off-balance. Take over-the-counter and prescription medicines only as told by your health care provider. These include  supplements. Eat a healthy diet and maintain a healthy weight. A healthy diet includes low-fat dairy products, low-fat (lean) meats, and fiber from whole grains, beans, and lots of fruits and vegetables. Stay current with your vaccines. Schedule regular health, dental, and eye exams. Summary Having a healthy lifestyle and getting preventive care can help to protect your health and wellness after age 70. Screening and testing are the best way to find a health problem early and help you avoid having a fall. Early diagnosis and treatment give you the best chance for managing medical conditions that are more common for people who are older than age 70. Falls are a major cause of broken bones and head injuries in people who are older than age 70. Take precautions to prevent a fall at home. Work with your health care provider to learn what changes you can make to improve your health and wellness and to prevent falls. This information is not intended to replace advice given to you by your health care provider. Make sure you discuss any questions you have with your health care provider. Document Revised: 04/03/2021 Document Reviewed: 04/03/2021 Elsevier Patient Education  2023 Elsevier Inc.  

## 2022-08-24 ENCOUNTER — Telehealth: Payer: Self-pay | Admitting: Family

## 2022-08-24 LAB — CBC WITH DIFFERENTIAL/PLATELET
Basophils Absolute: 0.1 10*3/uL (ref 0.0–0.2)
Basos: 2 %
EOS (ABSOLUTE): 0.1 10*3/uL (ref 0.0–0.4)
Eos: 2 %
Hematocrit: 45.1 % (ref 34.0–46.6)
Hemoglobin: 15.4 g/dL (ref 11.1–15.9)
Immature Grans (Abs): 0 10*3/uL (ref 0.0–0.1)
Immature Granulocytes: 0 %
Lymphocytes Absolute: 1.4 10*3/uL (ref 0.7–3.1)
Lymphs: 29 %
MCH: 32.6 pg (ref 26.6–33.0)
MCHC: 34.1 g/dL (ref 31.5–35.7)
MCV: 95 fL (ref 79–97)
Monocytes Absolute: 0.3 10*3/uL (ref 0.1–0.9)
Monocytes: 7 %
Neutrophils Absolute: 2.9 10*3/uL (ref 1.4–7.0)
Neutrophils: 60 %
Platelets: 232 10*3/uL (ref 150–450)
RBC: 4.73 x10E6/uL (ref 3.77–5.28)
RDW: 10.7 % — ABNORMAL LOW (ref 11.7–15.4)
WBC: 4.8 10*3/uL (ref 3.4–10.8)

## 2022-08-24 LAB — CMP14+EGFR
ALT: 14 IU/L (ref 0–32)
AST: 19 IU/L (ref 0–40)
Albumin/Globulin Ratio: 2.2 (ref 1.2–2.2)
Albumin: 4.7 g/dL (ref 3.9–4.9)
Alkaline Phosphatase: 78 IU/L (ref 44–121)
BUN/Creatinine Ratio: 18 (ref 12–28)
BUN: 13 mg/dL (ref 8–27)
Bilirubin Total: 0.5 mg/dL (ref 0.0–1.2)
CO2: 24 mmol/L (ref 20–29)
Calcium: 11 mg/dL — ABNORMAL HIGH (ref 8.7–10.3)
Chloride: 101 mmol/L (ref 96–106)
Creatinine, Ser: 0.73 mg/dL (ref 0.57–1.00)
Globulin, Total: 2.1 g/dL (ref 1.5–4.5)
Glucose: 102 mg/dL — ABNORMAL HIGH (ref 70–99)
Potassium: 4.7 mmol/L (ref 3.5–5.2)
Sodium: 140 mmol/L (ref 134–144)
Total Protein: 6.8 g/dL (ref 6.0–8.5)
eGFR: 88 mL/min/{1.73_m2} (ref >59)

## 2022-08-24 LAB — TSH: TSH: 0.976 u[IU]/mL (ref 0.450–4.500)

## 2022-08-24 NOTE — Telephone Encounter (Signed)
Patient aware and verbalized understanding. °

## 2022-09-03 ENCOUNTER — Telehealth: Payer: Self-pay | Admitting: Family

## 2022-09-10 ENCOUNTER — Ambulatory Visit (INDEPENDENT_AMBULATORY_CARE_PROVIDER_SITE_OTHER): Payer: Medicare Other | Admitting: Family Medicine

## 2022-09-10 DIAGNOSIS — J441 Chronic obstructive pulmonary disease with (acute) exacerbation: Secondary | ICD-10-CM | POA: Diagnosis not present

## 2022-09-10 MED ORDER — BENZONATATE 100 MG PO CAPS: 100.0000 mg | ORAL_CAPSULE | Freq: Three times a day (TID) | ORAL | 0 refills | Status: DC | PRN

## 2022-09-10 MED ORDER — PREDNISONE 20 MG PO TABS: ORAL_TABLET | ORAL | 0 refills | Status: DC

## 2022-09-10 MED ORDER — AZITHROMYCIN 250 MG PO TABS: ORAL_TABLET | ORAL | 0 refills | Status: DC

## 2022-09-10 MED ORDER — ALBUTEROL SULFATE HFA 108 (90 BASE) MCG/ACT IN AERS: 2.0000 | INHALATION_SPRAY | Freq: Four times a day (QID) | RESPIRATORY_TRACT | 0 refills | Status: AC | PRN

## 2022-09-10 NOTE — Progress Notes (Signed)
Telephone visit  Subjective: CC:URI PCP: Sharion Balloon, FNP VZD:GLOV Catherine Jensen is a 71 y.o. female calls for telephone consult today. Patient provides verbal consent for consult held via phone.  Due to COVID-19 pandemic this visit was conducted virtually. This visit type was conducted due to national recommendations for restrictions regarding the COVID-19 Pandemic (e.g. social distancing, sheltering in place) in an effort to limit this patient's exposure and mitigate transmission in our community. All issues noted in this document were discussed and addressed.  A physical exam was not performed with this format.   Location of patient: home Location of provider: [facility] Others present for call: spouse  1. Cough She reports history of COPD.  No use of inhaler. She reports wheezing. Cough is productive with greenish brown phlegm.  Symptoms onset about 1 week ago as only a sore throat but has progressed.  She reports subjective fevers.  Has not tested for COVID19.  She is a former smoker that stopped about 8 years ago.  She gets bronchitis once per year.  Been using mucinex with little improvement.   ROS: Per HPI  Allergies  Allergen Reactions   Bee Venom Hives and Swelling    syncope   Codeine Nausea And Vomiting    Dizzy   Musk Oil Fragrance Hives and Swelling    Swelling, syncope   Other Hives, Nausea And Vomiting and Swelling    Clorox- sweating, hives, swelling, nausea, syncope Cornbread- hives, swelling on face, syncope Lysol- swelling, nausea, syncope Bug spray- swelling, nausea, syncope   Shellfish Allergy Other (See Comments)    Patient was allergy tested and told she reacted to shellfish and not to eat.   Walnuts [Nuts] Hives and Swelling    syncope   Past Medical History:  Diagnosis Date   COPD (chronic obstructive pulmonary disease) ([place])    Endometriosis    FH: lung cancer    H/O blood clots    in legs   History of COVID-19 08/25/2021   Multiple allergies     pt states she took allergy shots x 20 yrs   Tobacco abuse     Current Outpatient Medications:    Ascorbic Acid (VITA-C PO), Take 1 tablet by mouth every other day., Disp: , Rfl:    BIOTIN PO, Take 1 tablet by mouth daily., Disp: , Rfl:    DENTAGEL 1.1 % GEL dental gel, Place 1 Application onto teeth at bedtime., Disp: , Rfl:    diphenhydrAMINE (BENADRYL) 25 MG tablet, Take 25 mg by mouth every 6 (six) hours as needed for allergies., Disp: , Rfl:    Lysine 1000 MG TABS, Take 1 tablet by mouth daily., Disp: , Rfl:   Assessment/ Plan: 71 y.o. female   COPD exacerbation ([place]) - Plan: azithromycin (ZITHROMAX) 250 MG tablet, albuterol (VENTOLIN HFA) 108 (90 Base) MCG/ACT inhaler, benzonatate (TESSALON PERLES) 100 MG capsule, predniSONE (DELTASONE) 20 MG tablet  Presumed COPD exacerbation but no formal diagnosis of COPD.  We discussed that this certainly could have been a COVID-19 infection and it may be insufficiently treated given lack of face-to-face appointment and testing.  Would recommend consideration for evaluation of this given recurrent bronchitis each year.  Z-Pak sent as this is what works well for her and she has had not had any intolerance with QTc prolongation.  Prednisone burst, Tessalon Perles and albuterol inhaler.  We discussed red flag signs and symptoms warranting further evaluation.  She voiced good understanding will follow-up as needed  Start time:  10:43a End time: 10:48a  Total time spent on patient care (including telephone call/ virtual visit): 5 minutes  [place], [place] [phone]

## 2022-09-12 DIAGNOSIS — Z85828 Personal history of other malignant neoplasm of skin: Secondary | ICD-10-CM | POA: Diagnosis not present

## 2022-09-12 DIAGNOSIS — L570 Actinic keratosis: Secondary | ICD-10-CM | POA: Diagnosis not present

## 2022-09-24 ENCOUNTER — Other Ambulatory Visit: Payer: Self-pay | Admitting: Obstetrics and Gynecology

## 2022-09-24 DIAGNOSIS — Z1231 Encounter for screening mammogram for malignant neoplasm of breast: Secondary | ICD-10-CM

## 2022-11-21 ENCOUNTER — Ambulatory Visit
Admission: RE | Admit: 2022-11-21 | Discharge: 2022-11-21 | Disposition: A | Discharge status: Home / Self Care | Payer: Medicare Other | Source: Ambulatory Visit | Attending: Obstetrics and Gynecology | Admitting: Obstetrics and Gynecology

## 2022-11-21 DIAGNOSIS — Z1231 Encounter for screening mammogram for malignant neoplasm of breast: Secondary | ICD-10-CM

## 2022-11-23 ENCOUNTER — Ambulatory Visit (INDEPENDENT_AMBULATORY_CARE_PROVIDER_SITE_OTHER): Payer: Medicare Other

## 2022-11-23 VITALS — Ht 59.0 in | Wt 91.0 lb

## 2022-11-23 DIAGNOSIS — Z0000 Encounter for general adult medical examination without abnormal findings: Secondary | ICD-10-CM | POA: Diagnosis not present

## 2022-11-23 NOTE — Progress Notes (Signed)
Subjective:   Catherine Jensen is a 70 y.o. female who presents for Medicare Annual (Subsequent) preventive examination. I connected with  [name] on 11/23/22 by a audio enabled telemedicine application and verified that I am speaking with the correct person using two identifiers.  Patient Location: Home  Provider Location: Home Office  I discussed the limitations of evaluation and management by telemedicine. The patient expressed understanding and agreed to proceed.  Review of Systems     Cardiac Risk Factors include: advanced age (>98men, >88 women)     Objective:    Today's Vitals   11/23/22 0900  Weight: 91 lb (41.3 kg)  Height: 4\' 11"  (1.499 m)   Body mass index is 18.38 kg/m.     11/23/2022    9:04 AM 07/05/2022    1:33 PM 06/18/2022   11:12 AM 03/14/2022    8:12 AM 11/22/2021    8:21 AM 02/10/2021   11:34 AM 11/21/2020    8:31 AM  Advanced Directives  Does Patient Have a Medical Advance Directive? Yes No No No No No No  Type of Paramedic of [place];Living will        Copy of [place] in Chart? No - copy requested        Would patient like information on creating a medical advance directive?   No - Patient declined  No - Patient declined No - Patient declined No - Patient declined    Current Medications (verified) Outpatient Encounter Medications as of 11/23/2022  Medication Sig   albuterol (VENTOLIN HFA) 108 (90 Base) MCG/ACT inhaler Inhale 2 puffs into the lungs every 6 (six) hours as needed for wheezing or shortness of breath.   Ascorbic Acid (VITA-C PO) Take 1 tablet by mouth every other day.   BIOTIN PO Take 1 tablet by mouth daily.   DENTAGEL 1.1 % GEL dental gel Place 1 Application onto teeth at bedtime.   diphenhydrAMINE (BENADRYL) 25 MG tablet Take 25 mg by mouth every 6 (six) hours as needed for allergies.   Lysine 1000 MG TABS Take 1 tablet by mouth daily.   azithromycin (ZITHROMAX) 250 MG  tablet Take 2 tablets today, then take 1 tablet daily until gone. (Patient not taking: Reported on 11/23/2022)   benzonatate (TESSALON PERLES) 100 MG capsule Take 1 capsule (100 mg total) by mouth 3 (three) times daily as needed. (Patient not taking: Reported on 11/23/2022)   predniSONE (DELTASONE) 20 MG tablet 2 po at same time daily for 5 days (Patient not taking: Reported on 11/23/2022)   No facility-administered encounter medications on file as of 11/23/2022.    Allergies (verified) Bee venom, Codeine, Musk oil fragrance, Other, Shellfish allergy, and Walnuts [nuts]   History: Past Medical History:  Diagnosis Date   COPD (chronic obstructive pulmonary disease) (HCC)    Endometriosis    FH: lung cancer    H/O blood clots    in legs   History of COVID-19 08/25/2021   Multiple allergies    pt states she took allergy shots x 20 yrs   Tobacco abuse    Past Surgical History:  Procedure Laterality Date   ABLATION ON ENDOMETRIOSIS     LUMBAR LAMINECTOMY/DECOMPRESSION MICRODISCECTOMY Left 06/21/2022   Procedure: LUMBAR FIVE TO SACRAL ONE DECOMPRESSION;  Surgeon: Melina Schools, MD;  Location: [facility];  Service: Orthopedics;  Laterality: Left;  2.5 hrs 3 C-Bed   SKIN LESION EXCISION     TOTAL ABDOMINAL HYSTERECTOMY  02/1990   TUBAL LIGATION     TUMOR REMOVAL     uterus   Family History  Problem Relation Age of Onset   Heart attack Brother    Diabetes Brother    Kidney failure Brother        44   Lung cancer Sister        w/ mets to brain   Asthma Sister    Crohn's disease Sister    Cancer Sister        lung cancer   Heart disease Mother    Diabetes Mother    Social History   Socioeconomic History   Marital status: Married    Spouse name: [name]   Number of children: 1   Years of education: 12   Highest education level: 12th grade  Occupational History   Occupation: Armed forces training and education officer: [employer]    Comment: exposed to [employer] @ work  Tobacco Use   Smoking  status: Former    Packs/day: 0.50    Years: 21.00    Total pack years: 10.50    Types: Cigarettes    Quit date: 11/26/2008    Years since quitting: 14.0   Smokeless tobacco: Never  Vaping Use   Vaping Use: Never used  Substance and Sexual Activity   Alcohol use: No   Drug use: No   Sexual activity: Not Currently    Partners: Male    Birth control/protection: Surgical    Comment: hysterectomy  Other Topics Concern   Not on file  Social History Narrative   One bio child - 2 step children   Still very active and independent   Enjoys playing golf   Social Determinants of Health   Financial Resource Strain: Low Risk  (11/23/2022)   Overall Financial Resource Strain (CARDIA)    Difficulty of Paying Living Expenses: Not hard at all  Food Insecurity: No Food Insecurity (11/23/2022)   Hunger Vital Sign    Worried About Running Out of Food in the Last Year: Never true    Ran Out of Food in the Last Year: Never true  Transportation Needs: No Transportation Needs (11/23/2022)   PRAPARE - Hydrologist (Medical): No    Lack of Transportation (Non-Medical): No  Physical Activity: Sufficiently Active (11/23/2022)   Exercise Vital Sign    Days of Exercise per Week: 5 days    Minutes of Exercise per Session: 30 min  Stress: No Stress Concern Present (11/23/2022)   [place]    Feeling of Stress : Not at all  Social Connections: Moderately Integrated (11/23/2022)   Social Connection and Isolation Panel [NHANES]    Frequency of Communication with Friends and Family: More than three times a week    Frequency of Social Gatherings with Friends and Family: More than three times a week    Attends Religious Services: Never    Marine scientist or Organizations: No    Attends Music therapist: 1 to 4 times per year    Marital Status: Married    Tobacco Counseling Counseling  given: Not Answered   Clinical Intake:  Pre-visit preparation completed: Yes  Pain : No/denies pain     Nutritional Risks: None Diabetes: No  How often do you need to have someone help you when you read instructions, pamphlets, or other written materials from your doctor or pharmacy?: 1 - Never  Diabetic?no  Interpreter Needed?: No  Information entered by :: Jadene Pierini, LPN   Activities of Daily Living    11/23/2022    9:04 AM 11/22/2022   10:25 AM  In your present state of health, do you have any difficulty performing the following activities:  Hearing? 0 0  Vision? 0 0  Difficulty concentrating or making decisions? 0 0  Walking or climbing stairs? 0 0  Dressing or bathing? 0 0  Doing errands, shopping? 0 0  Preparing Food and eating ? N N  Using the Toilet? N N  In the past six months, have you accidently leaked urine? N N  Do you have problems with loss of bowel control? N N  Managing your Medications? N N  Managing your Finances? N N  Housekeeping or managing your Housekeeping? N N    Patient Care Team: Sharion Balloon, FNP as PCP - General (Family Medicine) Ubaldo Glassing, Marny Lowenstein, MD (Inactive) as Referring Physician (Obstetrics and Gynecology)  Indicate any recent Medical Services you may have received from other than [facility] providers in the past year (date may be approximate).     Assessment:   This is a routine wellness examination for [facility].  Hearing/Vision screen Vision Screening - Comments:: Wears rx glasses - up to date with routine eye exams with  Dr.Groat  Dietary issues and exercise activities discussed: Current Exercise Habits: Home exercise routine, Type of exercise: walking, Time (Minutes): 30, Frequency (Times/Week): 3, Weekly Exercise (Minutes/Week): 90, Intensity: Mild, Exercise limited by: None identified   Goals Addressed             This Visit's Progress    DIET - INCREASE WATER INTAKE   On track    Try to drink 6-8 glasses of  water daily       Depression Screen    11/23/2022    9:03 AM 08/23/2022    8:08 AM 05/28/2022    2:44 PM 11/22/2021    8:19 AM 08/14/2021    9:30 AM 01/26/2021    9:37 AM 11/21/2020    8:29 AM  PHQ 2/9 Scores  PHQ - 2 Score 0 0 0 0 0 0 0  PHQ- 9 Score     0      Fall Risk    11/23/2022    9:01 AM 11/22/2022   10:25 AM 08/23/2022    8:08 AM 05/28/2022    2:44 PM 11/22/2021    8:28 AM  Fall Risk   Falls in the past year? 0 0 0 0 0  Number falls in past yr: 0 0   0  Injury with Fall? 0    0  Risk for fall due to : No Fall Risks    No Fall Risks  Follow up Falls prevention discussed    Falls prevention discussed    FALL RISK PREVENTION PERTAINING TO THE HOME:  Any stairs in or around the home? Yes  If so, are there any without handrails? No  Home free of loose throw rugs in walkways, pet beds, electrical cords, etc? Yes  Adequate lighting in your home to reduce risk of falls? Yes   ASSISTIVE DEVICES UTILIZED TO PREVENT FALLS:  Life alert? No  Use of a cane, walker or w/c? No  Grab bars in the bathroom? Yes  Shower chair or bench in shower? Yes  Elevated toilet seat or a handicapped toilet? Yes       07/09/2018    9:47 AM 01/09/2017   10:42  AM  MMSE - Mini Mental State Exam  Orientation to time 5 5  Orientation to Place 5 5  Registration 3 3  Attention/ Calculation 5 5  Recall 1 3  Language- name 2 objects 2 2  Language- repeat 1 1  Language- follow 3 step command 3 3  Language- read & follow direction 1 1  Write a sentence 1 1  Copy design 1 1  Total score 28 30        11/23/2022    9:04 AM 11/21/2020    8:33 AM 11/18/2019    9:40 AM  6CIT Screen  What Year? 0 points 0 points 0 points  What month? 0 points 0 points 0 points  What time? 0 points 0 points 0 points  Count back from 20 0 points 0 points 0 points  Months in reverse 0 points 2 points 2 points  Repeat phrase 0 points 0 points 0 points  Total Score 0 points 2 points 2 points     Immunizations Immunization History  Administered Date(s) Administered   Fluad Quad(high Dose 65+) 09/01/2020, 09/12/2021, 08/23/2022   Influenza, High Dose Seasonal PF 09/29/2018   Influenza,inj,Quad PF,6+ Mos 09/14/2013, 01/09/2017, 09/04/2017, 10/14/2019   Moderna Sars-Covid-2 Vaccination 12/23/2019, 01/20/2020, 11/07/2020   Pneumococcal Conjugate-13 01/09/2017   Pneumococcal Polysaccharide-23 05/23/2011, 07/24/2018   Pneumococcal-Unspecified 04/08/2013   Tdap 04/18/2016   Zoster Recombinat (Shingrix) 12/01/2021, 02/07/2022    TDAP status: Up to date  Flu Vaccine status: Up to date  Pneumococcal vaccine status: Up to date  Covid-19 vaccine status: Completed vaccines  Qualifies for Shingles Vaccine? Yes   Zostavax completed Yes   Shingrix Completed?: Yes  Screening Tests Health Maintenance  Topic Date Due   COVID-19 Vaccine (4 - 2023-24 season) 07/27/2022   Medicare Annual Wellness (AWV)  11/24/2023   MAMMOGRAM  11/21/2024   Fecal DNA (Cologuard)  01/01/2025   DTaP/Tdap/Td (2 - Td or Tdap) 04/18/2026   Pneumonia Vaccine 35+ Years old  Completed   INFLUENZA VACCINE  Completed   DEXA SCAN  Completed   Hepatitis C Screening  Completed   Zoster Vaccines- Shingrix  Completed   HPV VACCINES  Aged Out    Health Maintenance  Health Maintenance Due  Topic Date Due   COVID-19 Vaccine (4 - 2023-24 season) 07/27/2022    Colorectal cancer screening: Type of screening: Cologuard. Completed 01/01/2022. Repeat every 3 years  Mammogram status: Completed 11/21/2022. Repeat every year  Bone Density status: Completed 12/23/2020. Results reflect: Bone density results: OSTEOPENIA. Repeat every 5 years.  Lung Cancer Screening: (Low Dose CT Chest recommended if Age 49-80 years, 30 pack-year currently smoking OR have quit w/in 15years.) does not qualify.   Lung Cancer Screening Referral: n/a  Additional Screening:  Hepatitis C Screening: does not qualify;   Vision  Screening: Recommended annual ophthalmology exams for early detection of glaucoma and other disorders of the eye. Is the patient up to date with their annual eye exam?  Yes  Who is the provider or what is the name of the office in which the patient attends annual eye exams? Dr.Groat  If pt is not established with a provider, would they like to be referred to a provider to establish care? No .   Dental Screening: Recommended annual dental exams for proper oral hygiene  Community Resource Referral / Chronic Care Management: CRR required this visit?  No   CCM required this visit?  No      Plan:  I have personally reviewed and noted the following in the patient's chart:   Medical and social history Use of alcohol, tobacco or illicit drugs  Current medications and supplements including opioid prescriptions. Patient is not currently taking opioid prescriptions. Functional ability and status Nutritional status Physical activity Advanced directives List of other physicians Hospitalizations, surgeries, and ER visits in previous 12 months Vitals Screenings to include cognitive, depression, and falls Referrals and appointments  In addition, I have reviewed and discussed with patient certain preventive protocols, quality metrics, and best practice recommendations. A written personalized care plan for preventive services as well as general preventive health recommendations were provided to patient.     Daphane Shepherd, LPN   [id number]   Nurse Notes: none

## 2022-11-23 NOTE — Patient Instructions (Signed)
Catherine Jensen , Thank you for taking time to come for your Medicare Wellness Visit. I appreciate your ongoing commitment to your health goals. Please review the following plan we discussed and let me know if I can assist you in the future.   These are the goals we discussed:  Goals      DIET - INCREASE WATER INTAKE     Try to drink 6-8 glasses of water daily     Exercise 3x per week (30 min per time)     Have 3 meals a day        This is a list of the screening recommended for you and due dates:  Health Maintenance  Topic Date Due   COVID-19 Vaccine (4 - 2023-24 season) 07/27/2022   Medicare Annual Wellness Visit  11/24/2023   Mammogram  11/21/2024   Cologuard (Stool DNA test)  01/01/2025   DTaP/Tdap/Td vaccine (2 - Td or Tdap) 04/18/2026   Pneumonia Vaccine  Completed   Flu Shot  Completed   DEXA scan (bone density measurement)  Completed   Hepatitis C Screening: USPSTF Recommendation to screen - Ages 67-79 yo.  Completed   Zoster (Shingles) Vaccine  Completed   HPV Vaccine  Aged Out    Advanced directives: Advance directive discussed with you today. I have provided a copy for you to complete at home and have notarized. Once this is complete please bring a copy in to our office so we can scan it into your chart.   Conditions/risks identified: Aim for 30 minutes of exercise or brisk walking, 6-8 glasses of water, and 5 servings of fruits and vegetables each day.   Next appointment: Follow up in one year for your annual wellness visit    Preventive Care 65 Years and Older, Female Preventive care refers to lifestyle choices and visits with your health care provider that can promote health and wellness. What does preventive care include? A yearly physical exam. This is also called an annual well check. Dental exams once or twice a year. Routine eye exams. Ask your health care provider how often you should have your eyes checked. Personal lifestyle choices, including: Daily care  of your teeth and gums. Regular physical activity. Eating a healthy diet. Avoiding tobacco and drug use. Limiting alcohol use. Practicing safe sex. Taking low-dose aspirin every day. Taking vitamin and mineral supplements as recommended by your health care provider. What happens during an annual well check? The services and screenings done by your health care provider during your annual well check will depend on your age, overall health, lifestyle risk factors, and family history of disease. Counseling  Your health care provider may ask you questions about your: Alcohol use. Tobacco use. Drug use. Emotional well-being. Home and relationship well-being. Sexual activity. Eating habits. History of falls. Memory and ability to understand (cognition). Work and work Statistician. Reproductive health. Screening  You may have the following tests or measurements: Height, weight, and BMI. Blood pressure. Lipid and cholesterol levels. These may be checked every 5 years, or more frequently if you are over 28 years old. Skin check. Lung cancer screening. You may have this screening every year starting at age 71 if you have a 30-pack-year history of smoking and currently smoke or have quit within the past 15 years. Fecal occult blood test (FOBT) of the stool. You may have this test every year starting at age 98. Flexible sigmoidoscopy or colonoscopy. You may have a sigmoidoscopy every 5 years or a colonoscopy  every 10 years starting at age 34. Hepatitis C blood test. Hepatitis B blood test. Sexually transmitted disease (STD) testing. Diabetes screening. This is done by checking your blood sugar (glucose) after you have not eaten for a while (fasting). You may have this done every 1-3 years. Bone density scan. This is done to screen for osteoporosis. You may have this done starting at age 101. Mammogram. This may be done every 1-2 years. Talk to your health care provider about how often you  should have regular mammograms. Talk with your health care provider about your test results, treatment options, and if necessary, the need for more tests. Vaccines  Your health care provider may recommend certain vaccines, such as: Influenza vaccine. This is recommended every year. Tetanus, diphtheria, and acellular pertussis (Tdap, Td) vaccine. You may need a Td booster every 10 years. Zoster vaccine. You may need this after age 24. Pneumococcal 13-valent conjugate (PCV13) vaccine. One dose is recommended after age 37. Pneumococcal polysaccharide (PPSV23) vaccine. One dose is recommended after age 45. Talk to your health care provider about which screenings and vaccines you need and how often you need them. This information is not intended to replace advice given to you by your health care provider. Make sure you discuss any questions you have with your health care provider. Document Released: 12/09/2015 Document Revised: 08/01/2016 Document Reviewed: 09/13/2015 Elsevier Interactive Patient Education  2017 [place] Prevention in the Home Falls can cause injuries. They can happen to people of all ages. There are many things you can do to make your home safe and to help prevent falls. What can I do on the outside of my home? Regularly fix the edges of walkways and driveways and fix any cracks. Remove anything that might make you trip as you walk through a door, such as a raised step or threshold. Trim any bushes or trees on the path to your home. Use bright outdoor lighting. Clear any walking paths of anything that might make someone trip, such as rocks or tools. Regularly check to see if handrails are loose or broken. Make sure that both sides of any steps have handrails. Any raised decks and porches should have guardrails on the edges. Have any leaves, snow, or ice cleared regularly. Use sand or salt on walking paths during winter. Clean up any spills in your garage right away.  This includes oil or grease spills. What can I do in the bathroom? Use night lights. Install grab bars by the toilet and in the tub and shower. Do not use towel bars as grab bars. Use non-skid mats or decals in the tub or shower. If you need to sit down in the shower, use a plastic, non-slip stool. Keep the floor dry. Clean up any water that spills on the floor as soon as it happens. Remove soap buildup in the tub or shower regularly. Attach bath mats securely with double-sided non-slip rug tape. Do not have throw rugs and other things on the floor that can make you trip. What can I do in the bedroom? Use night lights. Make sure that you have a light by your bed that is easy to reach. Do not use any sheets or blankets that are too big for your bed. They should not hang down onto the floor. Have a firm chair that has side arms. You can use this for support while you get dressed. Do not have throw rugs and other things on the floor that can make  you trip. What can I do in the kitchen? Clean up any spills right away. Avoid walking on wet floors. Keep items that you use a lot in easy-to-reach places. If you need to reach something above you, use a strong step stool that has a grab bar. Keep electrical cords out of the way. Do not use floor polish or wax that makes floors slippery. If you must use wax, use non-skid floor wax. Do not have throw rugs and other things on the floor that can make you trip. What can I do with my stairs? Do not leave any items on the stairs. Make sure that there are handrails on both sides of the stairs and use them. Fix handrails that are broken or loose. Make sure that handrails are as long as the stairways. Check any carpeting to make sure that it is firmly attached to the stairs. Fix any carpet that is loose or worn. Avoid having throw rugs at the top or bottom of the stairs. If you do have throw rugs, attach them to the floor with carpet tape. Make sure that  you have a light switch at the top of the stairs and the bottom of the stairs. If you do not have them, ask someone to add them for you. What else can I do to help prevent falls? Wear shoes that: Do not have high heels. Have rubber bottoms. Are comfortable and fit you well. Are closed at the toe. Do not wear sandals. If you use a stepladder: Make sure that it is fully opened. Do not climb a closed stepladder. Make sure that both sides of the stepladder are locked into place. Ask someone to hold it for you, if possible. Clearly mark and make sure that you can see: Any grab bars or handrails. First and last steps. Where the edge of each step is. Use tools that help you move around (mobility aids) if they are needed. These include: Canes. Walkers. Scooters. Crutches. Turn on the lights when you go into a dark area. Replace any light bulbs as soon as they burn out. Set up your furniture so you have a clear path. Avoid moving your furniture around. If any of your floors are uneven, fix them. If there are any pets around you, be aware of where they are. Review your medicines with your doctor. Some medicines can make you feel dizzy. This can increase your chance of falling. Ask your doctor what other things that you can do to help prevent falls. This information is not intended to replace advice given to you by your health care provider. Make sure you discuss any questions you have with your health care provider. Document Released: 09/08/2009 Document Revised: 04/19/2016 Document Reviewed: 12/17/2014 Elsevier Interactive Patient Education  2017 [employer].

## 2023-01-16 DIAGNOSIS — H11043 Peripheral pterygium, stationary, bilateral: Secondary | ICD-10-CM | POA: Diagnosis not present

## 2023-01-16 DIAGNOSIS — H43392 Other vitreous opacities, left eye: Secondary | ICD-10-CM | POA: Diagnosis not present

## 2023-01-16 DIAGNOSIS — H04123 Dry eye syndrome of bilateral lacrimal glands: Secondary | ICD-10-CM | POA: Diagnosis not present

## 2023-01-16 DIAGNOSIS — H25813 Combined forms of age-related cataract, bilateral: Secondary | ICD-10-CM | POA: Diagnosis not present

## 2023-01-24 ENCOUNTER — Encounter: Payer: Self-pay | Admitting: Radiology

## 2023-03-18 DIAGNOSIS — Z85828 Personal history of other malignant neoplasm of skin: Secondary | ICD-10-CM | POA: Diagnosis not present

## 2023-03-18 DIAGNOSIS — L570 Actinic keratosis: Secondary | ICD-10-CM | POA: Diagnosis not present

## 2023-04-19 ENCOUNTER — Encounter: Payer: Self-pay | Admitting: Family Medicine

## 2023-04-19 ENCOUNTER — Ambulatory Visit (INDEPENDENT_AMBULATORY_CARE_PROVIDER_SITE_OTHER): Payer: Medicare Other | Admitting: Family Medicine

## 2023-04-19 VITALS — BP 176/84 | HR 79 | Ht 59.0 in | Wt 94.0 lb

## 2023-04-19 DIAGNOSIS — M5431 Sciatica, right side: Secondary | ICD-10-CM | POA: Diagnosis not present

## 2023-04-19 MED ORDER — METHYLPREDNISOLONE ACETATE 80 MG/ML IJ SUSP
80.0000 mg | Freq: Once | INTRAMUSCULAR | Status: AC
  Administered 2023-04-19: 80 mg via INTRAMUSCULAR

## 2023-04-19 NOTE — Progress Notes (Signed)
BP (!) 176/84   Pulse 79   Ht 4\' 11"  (1.499 m)   Wt 94 lb (42.6 kg)   LMP 02/24/1990 Comment: [facility] does pap,[name] CNM  SpO2 98%   BMI 18.99 kg/m    Subjective:   Patient ID: Catherine Jensen, female    DOB: 02/22/1951, 72 y.o.   MRN: [id number]  HPI: Catherine Jensen is a 72 y.o. female presenting on 04/19/2023 for Hip Pain (Radiates down right leg)   HPI Patient is coming in for right lower back pain that radiates down her right leg.  She says it has been bothering her over the past 5 days.  She says she was golfing and mowing and doing stuff before this but cannot think of anything specific that would have irritated or aggravated it more than normal.  She says she had a similar issue about a year ago and ended up having the surgery but does not know if that is like this or not, it was on the other side on the left before this was on the right.  Relevant past medical, surgical, family and social history reviewed and updated as indicated. Interim medical history since our last visit reviewed. Allergies and medications reviewed and updated.  Review of Systems  Constitutional:  Negative for chills and fever.  Eyes:  Negative for redness and visual disturbance.  Respiratory:  Negative for chest tightness and shortness of breath.   Cardiovascular:  Negative for chest pain and leg swelling.  Musculoskeletal:  Positive for arthralgias, back pain and myalgias. Negative for gait problem.  Skin:  Negative for rash.  Neurological:  Negative for light-headedness and headaches.  Psychiatric/Behavioral:  Negative for agitation and behavioral problems.   All other systems reviewed and are negative.   Per HPI unless specifically indicated above   Allergies as of 04/19/2023       Reactions   Bee Venom Hives, Swelling   syncope   Codeine Nausea And Vomiting   Dizzy   Musk Oil Fragrance Hives, Swelling   Swelling, syncope   Other Hives, Nausea And Vomiting,  Swelling   Clorox- sweating, hives, swelling, nausea, syncope Cornbread- hives, swelling on face, syncope Lysol- swelling, nausea, syncope Bug spray- swelling, nausea, syncope   Shellfish Allergy Other (See Comments)   Patient was allergy tested and told she reacted to shellfish and not to eat.   Walnuts [nuts] Hives, Swelling   syncope        Medication List        Accurate as of Apr 19, 2023 10:48 AM. If you have any questions, ask your nurse or doctor.          STOP taking these medications    azithromycin 250 MG tablet Commonly known as: ZITHROMAX Stopped by: Elige Radon Alexas Basulto, MD   predniSONE 20 MG tablet Commonly known as: DELTASONE Stopped by: Elige Radon Kailena Lubas, MD       TAKE these medications    albuterol 108 (90 Base) MCG/ACT inhaler Commonly known as: VENTOLIN HFA Inhale 2 puffs into the lungs every 6 (six) hours as needed for wheezing or shortness of breath.   benzonatate 100 MG capsule Commonly known as: Tessalon Perles Take 1 capsule (100 mg total) by mouth 3 (three) times daily as needed.   BIOTIN PO Take 1 tablet by mouth daily.   DentaGel 1.1 % Gel dental gel Generic drug: sodium fluoride Place 1 Application onto teeth at bedtime.   diphenhydrAMINE 25 MG tablet  Commonly known as: BENADRYL Take 25 mg by mouth every 6 (six) hours as needed for allergies.   Lysine 1000 MG Tabs Take 1 tablet by mouth daily.   VITA-C PO Take 1 tablet by mouth every other day.         Objective:   BP (!) 176/84   Pulse 79   Ht 4\' 11"  (1.499 m)   Wt 94 lb (42.6 kg)   LMP 02/24/1990 Comment: [facility] does pap,[name] CNM  SpO2 98%   BMI 18.99 kg/m   Wt Readings from Last 3 Encounters:  04/19/23 94 lb (42.6 kg)  11/23/22 91 lb (41.3 kg)  08/23/22 89 lb 6.4 oz (40.6 kg)    Physical Exam Vitals and nursing note reviewed.  Constitutional:      General: She is not in acute distress.    Appearance: She is well-developed. She is  not diaphoretic.  Eyes:     Conjunctiva/sclera: Conjunctivae normal.  Cardiovascular:     Rate and Rhythm: Normal rate and regular rhythm.     Heart sounds: Normal heart sounds. No murmur heard. Pulmonary:     Effort: Pulmonary effort is normal. No respiratory distress.     Breath sounds: Normal breath sounds. No wheezing.  Musculoskeletal:     Lumbar back: Tenderness present. No deformity or bony tenderness. Normal range of motion. Positive right straight leg raise test. Negative left straight leg raise test.       Back:  Skin:    General: Skin is warm and dry.     Findings: No rash.  Neurological:     Mental Status: She is alert and oriented to person, place, and time.     Coordination: Coordination normal.  Psychiatric:        Behavior: Behavior normal.       Assessment & Plan:   Problem List Items Addressed This Visit   None Visit Diagnoses     Right sciatic nerve pain    -  Primary   Relevant Medications   methylPREDNISolone acetate (DEPO-MEDROL) injection 80 mg (Start on 04/19/2023 11:00 AM)       Will do a steroid injection and she is going to continue with her stretching and exercises and if not improving then she will let us know. Follow up plan: Return if symptoms worsen or fail to improve.  Counseling provided for all of the vaccine components No orders of the defined types were placed in this encounter.   Arville Care, MD [facility] 04/19/2023, 10:48 AM

## 2023-04-24 ENCOUNTER — Telehealth: Payer: Self-pay | Admitting: Family

## 2023-04-24 NOTE — Telephone Encounter (Signed)
Patient aware and verbalized understanding. Patient is going to call specialist Dr. Shon Baton and speak with them.

## 2023-04-24 NOTE — Telephone Encounter (Signed)
Probably too soon for another injection but if she wants to we can refer her to physical therapy or to an orthopedic office and see if they can help further, let me know and we can place referral

## 2023-04-24 NOTE — Telephone Encounter (Signed)
Would it be to early to do another injection?

## 2023-08-26 ENCOUNTER — Ambulatory Visit (INDEPENDENT_AMBULATORY_CARE_PROVIDER_SITE_OTHER): Payer: Medicare Other | Admitting: Family

## 2023-08-26 ENCOUNTER — Encounter: Payer: Self-pay | Admitting: Family

## 2023-08-26 ENCOUNTER — Ambulatory Visit (INDEPENDENT_AMBULATORY_CARE_PROVIDER_SITE_OTHER): Payer: Medicare Other

## 2023-08-26 VITALS — BP 123/79 | HR 97 | Temp 98.0°F | Ht 59.0 in | Wt 91.4 lb

## 2023-08-26 DIAGNOSIS — Z136 Encounter for screening for cardiovascular disorders: Secondary | ICD-10-CM | POA: Diagnosis not present

## 2023-08-26 DIAGNOSIS — Z23 Encounter for immunization: Secondary | ICD-10-CM | POA: Diagnosis not present

## 2023-08-26 DIAGNOSIS — M8580 Other specified disorders of bone density and structure, unspecified site: Secondary | ICD-10-CM | POA: Diagnosis not present

## 2023-08-26 DIAGNOSIS — J449 Chronic obstructive pulmonary disease, unspecified: Secondary | ICD-10-CM | POA: Diagnosis not present

## 2023-08-26 DIAGNOSIS — Z0000 Encounter for general adult medical examination without abnormal findings: Secondary | ICD-10-CM

## 2023-08-26 DIAGNOSIS — Z780 Asymptomatic menopausal state: Secondary | ICD-10-CM

## 2023-08-26 DIAGNOSIS — Z0001 Encounter for general adult medical examination with abnormal findings: Secondary | ICD-10-CM

## 2023-08-26 NOTE — Progress Notes (Signed)
Subjective:    Patient ID: Catherine Jensen, female    DOB: 09-28-1951, 72 y.o.   MRN: [id number]  Chief Complaint  Patient presents with   Medical Management of Chronic Issues    HPI Pt presents to the office today for CPE and chronic follow up. Pt currently not taking any prescription medications, but does take several vitamins. Pt denies any headache, palpitations, SOB, or edema at this time.    She had back surgery on 06/21/22 and has been doing well. Reports her pain is 0 out 10.    She has COPD, but does not take any inhalers. She quit smoking about 2014 years. Reports she has SOB when walking up hills. She is walking 1 miles a day and plays golf three times a week.    Her last lab work showed hypercalcemia, but was cleared by Hematologists. Had a negative CT chest.   She has osteopenia, but has stopped her calcium and vit D.     Review of Systems  All other systems reviewed and are negative.      Family History  Problem Relation Age of Onset   Heart attack Brother    Diabetes Brother    Kidney failure Brother        70   Lung cancer Sister        w/ mets to brain   Asthma Sister    Crohn's disease Sister    Cancer Sister        lung cancer   Heart disease Mother    Diabetes Mother    Social History   Socioeconomic History   Marital status: Married    Spouse name: Quintin Alto   Number of children: 1   Years of education: 12   Highest education level: 12th grade  Occupational History   Occupation: Theatre stage manager: [employer]    Comment: exposed to [employer] @ work  Tobacco Use   Smoking status: Former    Current packs/day: 0.00    Average packs/day: 0.5 packs/day for 21.0 years (10.5 ttl pk-yrs)    Types: Cigarettes    Start date: 11/27/1987    Quit date: 11/26/2008    Years since quitting: 14.7   Smokeless tobacco: Never  Vaping Use   Vaping status: Never Used  Substance and Sexual Activity   Alcohol use: No   Drug use: No   Sexual activity: Not  Currently    Partners: Male    Birth control/protection: Surgical    Comment: hysterectomy  Other Topics Concern   Not on file  Social History Narrative   One bio child - 2 step children   Still very active and independent   Enjoys playing golf   Social Determinants of Health   Financial Resource Strain: Low Risk  (11/23/2022)   Overall Financial Resource Strain (CARDIA)    Difficulty of Paying Living Expenses: Not hard at all  Food Insecurity: No Food Insecurity (11/23/2022)   Hunger Vital Sign    Worried About Running Out of Food in the Last Year: Never true    Ran Out of Food in the Last Year: Never true  Transportation Needs: No Transportation Needs (11/23/2022)   PRAPARE - Administrator, Civil Service (Medical): No    Lack of Transportation (Non-Medical): No  Physical Activity: Sufficiently Active (11/23/2022)   Exercise Vital Sign    Days of Exercise per Week: 5 days    Minutes of Exercise per Session:  30 min  Stress: No Stress Concern Present (11/23/2022)   [employer] of Occupational Health - Occupational Stress Questionnaire    Feeling of Stress : Not at all  Social Connections: Moderately Integrated (11/23/2022)   Social Connection and Isolation Panel [NHANES]    Frequency of Communication with Friends and Family: More than three times a week    Frequency of Social Gatherings with Friends and Family: More than three times a week    Attends Religious Services: Never    Database administrator or Organizations: No    Attends Engineer, structural: 1 to 4 times per year    Marital Status: Married    Objective:   Physical Exam Vitals reviewed.  Constitutional:      General: She is not in acute distress.    Appearance: She is well-developed.  HENT:     Head: Normocephalic and atraumatic.     Right Ear: Tympanic membrane normal.     Left Ear: Tympanic membrane normal.  Eyes:     Pupils: Pupils are equal, round, and reactive to light.   Neck:     Thyroid: No thyromegaly.  Cardiovascular:     Rate and Rhythm: Normal rate and regular rhythm.     Heart sounds: Normal heart sounds. No murmur heard. Pulmonary:     Effort: Pulmonary effort is normal. No respiratory distress.     Breath sounds: Normal breath sounds. No wheezing.  Abdominal:     General: Bowel sounds are normal. There is no distension.     Palpations: Abdomen is soft.     Tenderness: There is no abdominal tenderness.  Musculoskeletal:        General: No tenderness. Normal range of motion.     Cervical back: Normal range of motion and neck supple.  Skin:    General: Skin is warm and dry.  Neurological:     Mental Status: She is alert and oriented to person, place, and time.     Cranial Nerves: No cranial nerve deficit.     Deep Tendon Reflexes: Reflexes are normal and symmetric.  Psychiatric:        Behavior: Behavior normal.        Thought Content: Thought content normal.        Judgment: Judgment normal.       BP 123/79   Pulse 97   Temp 98 F (36.7 C) (Temporal)   Ht 4\' 11"  (1.499 m)   Wt 97 lb 9.6 oz (44.3 kg)   LMP 02/24/1990 Comment: [facility] does pap,[name] CNM  SpO2 98%   BMI 19.71 kg/m      Assessment & Plan:  Catherine Jensen comes in today with chief complaint of Medical Management of Chronic Issues   Diagnosis and orders addressed:  1. Encounter for immunization - Flu Vaccine Trivalent High Dose (Fluad) - CMP14+EGFR - CBC with Differential/Platelet  2. Annual physical exam - CMP14+EGFR - CBC with Differential/Platelet - Lipid panel - TSH - VITAMIN D 25 Hydroxy (Vit-D Deficiency, Fractures)  3. Osteopenia, unspecified location - CMP14+EGFR - CBC with Differential/Platelet - VITAMIN D 25 Hydroxy (Vit-D Deficiency, Fractures) - DG [facility] DEXA  4. Hypercalcemia - CMP14+EGFR - CBC with Differential/Platelet  5. Chronic obstructive pulmonary disease, unspecified COPD type (HCC) - CMP14+EGFR - CBC  with Differential/Platelet   Labs pending Health Maintenance reviewed Diet and exercise encouraged  Follow up plan: 1 year   Jannifer Rodney, FNP

## 2023-08-26 NOTE — Patient Instructions (Signed)
Health Maintenance After Age 71 After age 71, you are at a higher risk for certain long-term diseases and infections as well as injuries from falls. Falls are a major cause of broken bones and head injuries in people who are older than age 71. Getting regular preventive care can help to keep you healthy and well. Preventive care includes getting regular testing and making lifestyle changes as recommended by your health care provider. Talk with your health care provider about: Which screenings and tests you should have. A screening is a test that checks for a disease when you have no symptoms. A diet and exercise plan that is right for you. What should I know about screenings and tests to prevent falls? Screening and testing are the best ways to find a health problem early. Early diagnosis and treatment give you the best chance of managing medical conditions that are common after age 71. Certain conditions and lifestyle choices may make you more likely to have a fall. Your health care provider may recommend: Regular vision checks. Poor vision and conditions such as cataracts can make you more likely to have a fall. If you wear glasses, make sure to get your prescription updated if your vision changes. Medicine review. Work with your health care provider to regularly review all of the medicines you are taking, including over-the-counter medicines. Ask your health care provider about any side effects that may make you more likely to have a fall. Tell your health care provider if any medicines that you take make you feel dizzy or sleepy. Strength and balance checks. Your health care provider may recommend certain tests to check your strength and balance while standing, walking, or changing positions. Foot health exam. Foot pain and numbness, as well as not wearing proper footwear, can make you more likely to have a fall. Screenings, including: Osteoporosis screening. Osteoporosis is a condition that causes  the bones to get weaker and break more easily. Blood pressure screening. Blood pressure changes and medicines to control blood pressure can make you feel dizzy. Depression screening. You may be more likely to have a fall if you have a fear of falling, feel depressed, or feel unable to do activities that you used to do. Alcohol use screening. Using too much alcohol can affect your balance and may make you more likely to have a fall. Follow these instructions at home: Lifestyle Do not drink alcohol if: Your health care provider tells you not to drink. If you drink alcohol: Limit how much you have to: 0-1 drink a day for women. 0-2 drinks a day for men. Know how much alcohol is in your drink. In the [place] one drink equals one 12 oz bottle of beer (355 mL), one 5 oz glass of wine (148 mL), or one 1 oz glass of hard liquor (44 mL). Do not use any products that contain nicotine or tobacco. These products include cigarettes, chewing tobacco, and vaping devices, such as e-cigarettes. If you need help quitting, ask your health care provider. Activity  Follow a regular exercise program to stay fit. This will help you maintain your balance. Ask your health care provider what types of exercise are appropriate for you. If you need a cane or walker, use it as recommended by your health care provider. Wear supportive shoes that have nonskid soles. Safety  Remove any tripping hazards, such as rugs, cords, and clutter. Install safety equipment such as grab bars in bathrooms and safety rails on stairs. Keep rooms and walkways   well-lit. General instructions Talk with your health care provider about your risks for falling. Tell your health care provider if: You fall. Be sure to tell your health care provider about all falls, even ones that seem minor. You feel dizzy, tiredness (fatigue), or off-balance. Take over-the-counter and prescription medicines only as told by your health care provider. These include  supplements. Eat a healthy diet and maintain a healthy weight. A healthy diet includes low-fat dairy products, low-fat (lean) meats, and fiber from whole grains, beans, and lots of fruits and vegetables. Stay current with your vaccines. Schedule regular health, dental, and eye exams. Summary Having a healthy lifestyle and getting preventive care can help to protect your health and wellness after age 71. Screening and testing are the best way to find a health problem early and help you avoid having a fall. Early diagnosis and treatment give you the best chance for managing medical conditions that are more common for people who are older than age 71. Falls are a major cause of broken bones and head injuries in people who are older than age 71. Take precautions to prevent a fall at home. Work with your health care provider to learn what changes you can make to improve your health and wellness and to prevent falls. This information is not intended to replace advice given to you by your health care provider. Make sure you discuss any questions you have with your health care provider. Document Revised: 04/03/2021 Document Reviewed: 04/03/2021 Elsevier Patient Education  2024 Elsevier Inc.  

## 2023-08-27 ENCOUNTER — Telehealth: Payer: Self-pay | Admitting: Family

## 2023-08-27 ENCOUNTER — Other Ambulatory Visit: Payer: Self-pay | Admitting: Family

## 2023-08-27 LAB — CMP14+EGFR
ALT: 16 [IU]/L (ref 0–32)
AST: 26 [IU]/L (ref 0–40)
Albumin: 4.6 g/dL (ref 3.8–4.8)
Alkaline Phosphatase: 85 [IU]/L (ref 44–121)
BUN/Creatinine Ratio: 17 (ref 12–28)
BUN: 13 mg/dL (ref 8–27)
Bilirubin Total: 0.5 mg/dL (ref 0.0–1.2)
CO2: 25 mmol/L (ref 20–29)
Calcium: 10.7 mg/dL — ABNORMAL HIGH (ref 8.7–10.3)
Chloride: 100 mmol/L (ref 96–106)
Creatinine, Ser: 0.76 mg/dL (ref 0.57–1.00)
Globulin, Total: 2.1 g/dL (ref 1.5–4.5)
Glucose: 101 mg/dL — ABNORMAL HIGH (ref 70–99)
Potassium: 4.6 mmol/L (ref 3.5–5.2)
Sodium: 141 mmol/L (ref 134–144)
Total Protein: 6.7 g/dL (ref 6.0–8.5)
eGFR: 84 mL/min/{1.73_m2} (ref >59)

## 2023-08-27 LAB — CBC WITH DIFFERENTIAL/PLATELET
Basophils Absolute: 0.1 10*3/uL (ref 0.0–0.2)
Basos: 1 %
EOS (ABSOLUTE): 0.1 10*3/uL (ref 0.0–0.4)
Eos: 3 %
Hematocrit: 45.8 % (ref 34.0–46.6)
Hemoglobin: 15.1 g/dL (ref 11.1–15.9)
Immature Grans (Abs): 0 10*3/uL (ref 0.0–0.1)
Immature Granulocytes: 0 %
Lymphocytes Absolute: 1.4 10*3/uL (ref 0.7–3.1)
Lymphs: 33 %
MCH: 31.9 pg (ref 26.6–33.0)
MCHC: 33 g/dL (ref 31.5–35.7)
MCV: 97 fL (ref 79–97)
Monocytes Absolute: 0.3 10*3/uL (ref 0.1–0.9)
Monocytes: 8 %
Neutrophils Absolute: 2.3 10*3/uL (ref 1.4–7.0)
Neutrophils: 55 %
Platelets: 231 10*3/uL (ref 150–450)
RBC: 4.73 x10E6/uL (ref 3.77–5.28)
RDW: 10.5 % — ABNORMAL LOW (ref 11.7–15.4)
WBC: 4.2 10*3/uL (ref 3.4–10.8)

## 2023-08-27 LAB — LIPID PANEL
Chol/HDL Ratio: 2.9 {ratio} (ref 0.0–4.4)
Cholesterol, Total: 221 mg/dL — ABNORMAL HIGH (ref 100–199)
HDL: 77 mg/dL (ref >39)
LDL Chol Calc (NIH): 126 mg/dL — ABNORMAL HIGH (ref 0–99)
Triglycerides: 105 mg/dL (ref 0–149)
VLDL Cholesterol Cal: 18 mg/dL (ref 5–40)

## 2023-08-27 LAB — VITAMIN D 25 HYDROXY (VIT D DEFICIENCY, FRACTURES): Vit D, 25-Hydroxy: 78.4 ng/mL (ref 30.0–100.0)

## 2023-08-27 LAB — TSH: TSH: 1.54 u[IU]/mL (ref 0.450–4.500)

## 2023-08-27 MED ORDER — ROSUVASTATIN CALCIUM 5 MG PO TABS: 5.0000 mg | ORAL_TABLET | Freq: Every day | ORAL | 3 refills | Status: DC

## 2023-08-27 NOTE — Telephone Encounter (Signed)
Refer to lab results.  

## 2023-08-28 DIAGNOSIS — M810 Age-related osteoporosis without current pathological fracture: Secondary | ICD-10-CM | POA: Diagnosis not present

## 2023-08-28 DIAGNOSIS — Z780 Asymptomatic menopausal state: Secondary | ICD-10-CM | POA: Diagnosis not present

## 2023-08-29 ENCOUNTER — Other Ambulatory Visit: Payer: Self-pay | Admitting: Family

## 2023-08-29 DIAGNOSIS — M810 Age-related osteoporosis without current pathological fracture: Secondary | ICD-10-CM

## 2023-08-29 MED ORDER — ALENDRONATE SODIUM 70 MG PO TABS: 70.0000 mg | ORAL_TABLET | ORAL | 11 refills | Status: DC

## 2023-09-03 ENCOUNTER — Telehealth: Payer: Self-pay | Admitting: Family

## 2023-09-03 ENCOUNTER — Telehealth: Payer: Self-pay

## 2023-09-03 NOTE — Progress Notes (Unsigned)
Care Guide Note  09/03/2023 Name: Catherine Jensen MRN: [id number] DOB: 05-Dec-1950  Referred by: Junie Spencer, FNP Reason for referral : Care Coordination (Outreach to schedule with Pharm d )   Catherine Jensen is a 72 y.o. year old female who is a primary care patient of Junie Spencer, FNP. [name] was referred to the pharmacist for assistance related to  osteoporosis .    An unsuccessful telephone outreach was attempted today to contact the patient who was referred to the pharmacy team for assistance with medication management. Additional attempts will be made to contact the patient.   [name], RMA Care Guide [facility]  [place], [place] [address] Direct Dial: [phone] [name]

## 2023-09-04 NOTE — Telephone Encounter (Signed)
Patient Dietitian back. [facility] called in a RX and don't know what it's for.

## 2023-09-04 NOTE — Telephone Encounter (Signed)
Called on dexa see results

## 2023-09-05 NOTE — Progress Notes (Signed)
Care Guide Note  09/05/2023 Name: Catherine Jensen MRN: [id number] DOB: Jun 03, 1951  Referred by: Junie Spencer, FNP Reason for referral : Care Coordination (Outreach to schedule with Pharm d )   Catherine Jensen is a 72 y.o. year old female who is a primary care patient of Junie Spencer, FNP. Catherine Jensen was referred to the pharmacist for assistance related to  osteoporosis  .    Successful contact was made with the patient to discuss pharmacy services including being ready for the pharmacist to call at least 5 minutes before the scheduled appointment time, to have medication bottles and any blood sugar or blood pressure readings ready for review. The patient agreed to meet with the pharmacist via with the pharmacist via telephone visit on (date/time).  10/04/2023  [name], RMA Care Guide [facility]  [place], [place] [address] Direct Dial: [phone] [email or web address]

## 2023-09-09 ENCOUNTER — Encounter: Payer: Self-pay | Admitting: Nurse Practitioner

## 2023-09-09 ENCOUNTER — Ambulatory Visit (INDEPENDENT_AMBULATORY_CARE_PROVIDER_SITE_OTHER): Payer: Medicare Other | Admitting: Nurse Practitioner

## 2023-09-09 VITALS — BP 130/76 | HR 71 | Temp 97.5°F | Ht 59.0 in | Wt 93.6 lb

## 2023-09-09 DIAGNOSIS — R051 Acute cough: Secondary | ICD-10-CM | POA: Diagnosis not present

## 2023-09-09 DIAGNOSIS — J440 Chronic obstructive pulmonary disease with acute lower respiratory infection: Secondary | ICD-10-CM | POA: Diagnosis not present

## 2023-09-09 DIAGNOSIS — J209 Acute bronchitis, unspecified: Secondary | ICD-10-CM | POA: Diagnosis not present

## 2023-09-09 MED ORDER — AZITHROMYCIN 250 MG PO TABS
ORAL_TABLET | ORAL | 0 refills | Status: DC
Start: 2023-09-09 — End: 2023-09-25

## 2023-09-09 NOTE — Progress Notes (Signed)
Acute Office Visit  Subjective:     Patient ID: Catherine Jensen, female    DOB: [birth year], 72 y.o.   MRN: [id number]  Chief Complaint  Patient presents with   Sore Throat    Symptoms started Friday night. Says she gets this way every year at this time   Cough    Coughing up green mucus   chest congestion    HPI Cough: Patient complains of productive cough and productive cough with sputum described as yellow and green.  Symptoms began 6 days ago.  The cough is without wheezing, dyspnea or hemoptysis, productive of green/yellow sputum and is aggravated by nothing Associated symptoms include:postnasal drip.. Patient does not have recent travel.   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  HENT:  Negative for sinus pain and sore throat.   Respiratory:  Positive for cough and sputum production.        Green-yellow  Cardiovascular:  Negative for chest pain and leg swelling.  Gastrointestinal:  Negative for blood in stool, melena, nausea and vomiting.  Musculoskeletal:  Negative for falls and myalgias.  Neurological:  Negative for dizziness and headaches.   Negative unless indicated in HPI    Objective:    BP 130/76   Pulse 71   Temp (!) 97.5 F (36.4 C) (Temporal)   Ht 4\' 11"  (1.499 m)   Wt 93 lb 9.6 oz (42.5 kg)   LMP 02/24/1990 Comment: [facility] does pap,[name] CNM  SpO2 97%   BMI 18.90 kg/m  BP Readings from Last 3 Encounters:  09/09/23 130/76  08/26/23 123/79  04/19/23 (!) 176/84   Wt Readings from Last 3 Encounters:  09/09/23 93 lb 9.6 oz (42.5 kg)  08/26/23 91 lb 6.4 oz (41.5 kg)  04/19/23 94 lb (42.6 kg)      Physical Exam Vitals and nursing note reviewed.  Constitutional:      General: She is not in acute distress.    Appearance: Normal appearance.  HENT:     Head: Normocephalic and atraumatic.  Eyes:     Extraocular Movements: Extraocular movements intact.     Conjunctiva/sclera: Conjunctivae normal.     Pupils: Pupils are equal, round, and reactive to light.  Cardiovascular:     Rate and Rhythm: Normal rate and regular rhythm.  Pulmonary:     Effort: Pulmonary effort is normal.     Breath sounds: Normal breath sounds.  Musculoskeletal:        General: Normal range of motion.     Cervical back: Normal range of motion and neck supple.     Right lower leg: No edema.     Left lower leg: No edema.  Skin:    General: Skin is warm and dry.     Coloration: Skin is not jaundiced.  Findings: Rash present.  Neurological:     Mental Status: She is alert and oriented to person, place, and time. Mental status is at baseline.  Psychiatric:        Mood and Affect: Mood normal.        Behavior: Behavior normal.        Thought Content: Thought content normal.        Judgment: Judgment normal.     No results found for any visits on 09/09/23.      Assessment & Plan:  Acute cough -     Azithromycin; Take 2-tabs the first day and 1- tab daily until done  Dispense: 6 each; Refill: 0  Catherine Jensen is a 72 yrs old female, no acute distress Acute cough/Possible Bronchitis Z-pack # 6  Increased hydration Tylenol/ibuprofen for fever  Return if symptoms worsen or fail to improve.  Arrie Aran Santa Lighter, DNP [facility] [address] [place], [place] [address] [phone]

## 2023-09-10 ENCOUNTER — Telehealth: Payer: Self-pay | Admitting: Family

## 2023-09-10 NOTE — Telephone Encounter (Signed)
Called and spoke with patient about Dexa and lab went over everything and she was glad I called and understood what was said she is going to keep appt with Raynelle Fanning and discuss in more length

## 2023-09-11 NOTE — Progress Notes (Signed)
72 y.o. G41P1001 Married Caucasian female here for breast and pelvic exam.   She will see pharmacist at [facility] to discuss Fosamax.  She has not started it yet.   Has periodontal disease and sees her dental team every 4 months for cleanings.   She has high calcium.  Normal PTH.  Had reassuring evaluation with oncology.   Has urinary frequency.  Had about 6 caffeine beverages per day.   PCP: Junie Spencer, FNP   Patient's last menstrual period was 02/24/1990.           Sexually active: No.  The current method of family planning is status post hysterectomy.    Exercising: Yes.     Weights, golf, walking Smoker:  former  OB History  Gravida Para Term Preterm AB Living  1 1 1     1   SAB IAB Ectopic Multiple Live Births          1    # Outcome Date GA Lbr Len/2nd Weight Sex Type Anes PTL Lv  1 Term     M Vag-Spont   LIV     Health Maintenance: Pap:  06-09-12 Neg  History of abnormal Pap:  no MMG: 11/21/22 Breast Density Cat C, BI-RADS CAT 1 neg Colonoscopy:  cologuard 2023 - negative per pt HM Colonoscopy     This patient has no relevant Health Maintenance data.      BMD: 08/28/23   Result  osteoporosis of forearm, osteopenia of hips and spine.  Has not started Fosamax yet.  HIV: 04/18/16 NR Hep C: 04/18/16 neg  Immunization History  Administered Date(s) Administered   Fluad Quad(high Dose 65+) 09/01/2020, 09/12/2021, 08/23/2022   Fluad Trivalent(High Dose 65+) 08/26/2023   Influenza, High Dose Seasonal PF 09/29/2018   Influenza,inj,Quad PF,6+ Mos 09/14/2013, 01/09/2017, 09/04/2017, 10/14/2019   Moderna Sars-Covid-2 Vaccination 12/23/2019, 01/20/2020, 11/07/2020   Pneumococcal Conjugate-13 01/09/2017   Pneumococcal Polysaccharide-23 05/23/2011, 07/24/2018   Pneumococcal-Unspecified 04/08/2013   Tdap 04/18/2016   Zoster Recombinant(Shingrix) 12/01/2021, 02/07/2022      reports that she quit smoking about 14 years ago. Her smoking use included  cigarettes. She started smoking about 35 years ago. She has a 10.5 pack-year smoking history. She has never used smokeless tobacco. She reports that she does not drink alcohol and does not use drugs.  Past Medical History:  Diagnosis Date   COPD (chronic obstructive pulmonary disease) (HCC)    Endometriosis    FH: lung cancer    H/O blood clots    in legs   History of COVID-19 08/25/2021   History of ulcer disease    Multiple allergies    pt states she took allergy shots x 20 yrs   Tobacco abuse     Past Surgical History:  Procedure Laterality Date   ABLATION ON ENDOMETRIOSIS     LUMBAR LAMINECTOMY/DECOMPRESSION MICRODISCECTOMY Left 06/21/2022   Procedure: LUMBAR FIVE TO SACRAL ONE DECOMPRESSION;  Surgeon: Venita Lick, MD;  Location: [facility] OR;  Service: Orthopedics;  Laterality: Left;  2.5 hrs 3 C-Bed   SKIN LESION EXCISION     TOTAL ABDOMINAL HYSTERECTOMY  02/1990   TUBAL LIGATION     TUMOR REMOVAL     uterus    Current Outpatient Medications  Medication Sig Dispense Refill   albuterol (VENTOLIN HFA) 108 (90 Base) MCG/ACT inhaler Inhale 2 puffs into the lungs every 6 (six) hours as needed for wheezing or shortness of breath. 8 g 0   Ascorbic Acid (VITA-C PO)  Take 1 tablet by mouth every other day.     BIOTIN PO Take 1 tablet by mouth daily.     DENTAGEL 1.1 % GEL dental gel Place 1 Application onto teeth at bedtime.     diphenhydrAMINE (BENADRYL) 25 MG tablet Take 25 mg by mouth every 6 (six) hours as needed for allergies.     Lysine 1000 MG TABS Take 1 tablet by mouth daily.     Multiple Vitamin (MULTIVITAMIN) capsule Take 1 capsule by mouth daily.     rosuvastatin (CRESTOR) 5 MG tablet Take 1 tablet (5 mg total) by mouth daily. 90 tablet 3   alendronate (FOSAMAX) 70 MG tablet Take 1 tablet (70 mg total) by mouth every 7 (seven) days. Take with a full glass of water on an empty stomach. (Patient not taking: Reported on 09/25/2023) 4 tablet 11   No current  facility-administered medications for this visit.    Family History  Problem Relation Age of Onset   Heart attack Brother    Diabetes Brother    Kidney failure Brother        73   Lung cancer Sister        w/ mets to brain   Asthma Sister    Crohn's disease Sister    Cancer Sister        lung cancer   Heart disease Mother    Diabetes Mother     Review of Systems  All other systems reviewed and are negative.   Exam:   BP 118/76 (BP Location: Right Arm, Patient Position: Sitting, Cuff Size: Normal)   Pulse 99   Ht 5' (1.524 m)   Wt 93 lb (42.2 kg)   LMP 02/24/1990 Comment: [facility] does pap,[name] CNM  SpO2 100%   BMI 18.16 kg/m     General appearance: alert, cooperative and appears stated age Head: normocephalic, without obvious abnormality, atraumatic Neck: no adenopathy, supple, symmetrical, trachea midline and thyroid normal to inspection and palpation Lungs: clear to auscultation bilaterally Breasts: normal appearance, no masses or tenderness, No nipple retraction or dimpling, No nipple discharge or bleeding, No axillary adenopathy Heart: regular rate and rhythm Abdomen: soft, non-tender; no masses, no organomegaly Extremities: extremities normal, atraumatic, no cyanosis or edema Skin: skin color, texture, turgor normal. No rashes or lesions Lymph nodes: cervical, supraclavicular, and axillary nodes normal. Neurologic: grossly normal  Pelvic: External genitalia:  no lesions              No abnormal inguinal nodes palpated.              Urethra:  normal appearing urethra with no masses, tenderness or lesions              Bartholins and Skenes: normal                 Vagina: normal appearing vagina with normal color and discharge, no lesions.  Atrophy noted.               Cervix: absent              Pap taken: no Bimanual Exam:  Uterus:  absent.              Adnexa: no mass, fullness, tenderness              Rectal exam: Yes.  .  Confirms.               Anus:  normal sphincter tone, no  lesions  Chaperone was present for exam:  Warren Lacy, CMA   Assessment  Encounter for breast and pelvic exam.   Status post TAH.  Ovaries remain.  Vaginal atrophy.  Osteoporosis.  PCP monitoring.  Hx periodontal disease.  Hx ulcer many years ago.  Urinary frequency.  Heavy caffeine use.  Elevated calcium.  Uncertain etiology.  Hx blood clot in legs.   Plan: Mammogram screening discussed. Self breast awareness reviewed. Guidelines for Calcium, Vitamin D, regular exercise program including cardiovascular and weight bearing exercise. Pap and HR HPV not indicated.  We discussed her bone density report.  Fosamax reviewed.  Written information given.  She will follow up with her Pharm-D at her PCP office.  We discussed reducing or eliminating caffeine use.  Follow up for breast and pelvic exam 2 years and prn.   30  total time was spent for this patient encounter, including preparation, face-to-face counseling with the patient, coordination of care, and documentation of the encounter in addition to doing the breast and pelvic exam.

## 2023-09-23 DIAGNOSIS — L570 Actinic keratosis: Secondary | ICD-10-CM | POA: Diagnosis not present

## 2023-09-23 DIAGNOSIS — D485 Neoplasm of uncertain behavior of skin: Secondary | ICD-10-CM | POA: Diagnosis not present

## 2023-09-25 ENCOUNTER — Encounter: Payer: Self-pay | Admitting: Obstetrics and Gynecology

## 2023-09-25 ENCOUNTER — Ambulatory Visit (INDEPENDENT_AMBULATORY_CARE_PROVIDER_SITE_OTHER): Payer: Medicare Other | Admitting: Obstetrics and Gynecology

## 2023-09-25 ENCOUNTER — Other Ambulatory Visit: Payer: Self-pay | Admitting: Family

## 2023-09-25 VITALS — BP 118/76 | HR 99 | Ht 60.0 in | Wt 93.0 lb

## 2023-09-25 DIAGNOSIS — M810 Age-related osteoporosis without current pathological fracture: Secondary | ICD-10-CM | POA: Diagnosis not present

## 2023-09-25 DIAGNOSIS — R350 Frequency of micturition: Secondary | ICD-10-CM | POA: Diagnosis not present

## 2023-09-25 DIAGNOSIS — Z1231 Encounter for screening mammogram for malignant neoplasm of breast: Secondary | ICD-10-CM

## 2023-09-25 DIAGNOSIS — Z01419 Encounter for gynecological examination (general) (routine) without abnormal findings: Secondary | ICD-10-CM | POA: Diagnosis not present

## 2023-09-25 NOTE — Patient Instructions (Addendum)
Alendronate Tablets What is this medication? ALENDRONATE (a LEN droe nate) prevents and treats osteoporosis. It may also be used to treat Paget disease of the bone. It works by Interior and spatial designer stronger and less likely to break (fracture). It belongs to a group of medications called bisphosphonates. This medicine may be used for other purposes; ask your health care provider or pharmacist if you have questions. COMMON BRAND NAME(S): Fosamax What should I tell my care team before I take this medication? They need to know if you have any of these conditions: Bleeding disorder Cancer Dental disease Difficulty swallowing Infection (fever, chills, cough, sore throat, pain or trouble passing urine) Kidney disease Low levels of calcium or other minerals in the blood Low red blood cell counts Receiving steroids like dexamethasone or prednisone Stomach or intestine problems Trouble sitting or standing for 30 minutes An unusual or allergic reaction to alendronate, other medications, foods, dyes or preservatives Pregnant or trying to get pregnant Breast-feeding How should I use this medication? Take this medication by mouth with a full glass of water. Take it as directed on the prescription label at the same time every day. Take the dose right after waking up. Do not eat or drink anything before taking it. Do not take it with any other drink except water. Do not chew or crush the tablet. After taking it, do not eat breakfast, drink, or take any other medications or vitamins for at least 30 minutes. Sit or stand up for at least 30 minutes after you take it. Do not lie down. Keep taking it unless your care team tells you to stop. A special MedGuide will be given to you by the pharmacist with each prescription and refill. Be sure to read this information carefully each time. Talk to your care team about the use of this medication in children. Special care may be needed. Overdosage: If you think you have  taken too much of this medicine contact a poison control center or emergency room at once. NOTE: This medicine is only for you. Do not share this medicine with others. What if I miss a dose? If you take your medication once a day, skip it. Take your next dose at the scheduled time the next morning. Do not take two doses on the same day. If you take your medication once a week, take the missed dose on the morning after you remember. Do not take two doses on the same day. What may interact with this medication? Aluminum hydroxide Antacids Aspirin Calcium supplements Medications for inflammation like ibuprofen, naproxen, and others Iron supplements Magnesium supplements Vitamins with minerals This list may not describe all possible interactions. Give your health care provider a list of all the medicines, herbs, non-prescription drugs, or dietary supplements you use. Also tell them if you smoke, drink alcohol, or use illegal drugs. Some items may interact with your medicine. What should I watch for while using this medication? Visit your care team for regular checks on your progress. It may be some time before you see the benefit from this medication. Some people who take this medication have severe bone, joint, or muscle pain. This medication may also increase your risk for jaw problems or a broken thigh bone. Tell your care team right away if you have severe pain in your jaw, bones, joints, or muscles. Tell you care team if you have any pain that does not go away or that gets worse. Tell your dentist and dental surgeon that you are  taking this medication. You should not have major dental surgery while on this medication. See your dentist to have a dental exam and fix any dental problems before starting this medication. Take good care of your teeth while on this medication. Make sure you see your dentist for regular follow-up appointments. You should make sure you get enough calcium and vitamin D  while you are taking this medication. Discuss the foods you eat and the vitamins you take with your care team. You may need blood work done while you are taking this medication. What side effects may I notice from receiving this medication? Side effects that you should report to your care team as soon as possible: Allergic reactions--skin rash, itching, hives, swelling of the face, lips, tongue, or throat Low calcium level--muscle pain or cramps, confusion, tingling, or numbness in the hands or feet Osteonecrosis of the jaw--pain, swelling, or redness in the mouth, numbness of the jaw, poor healing after dental work, unusual discharge from the mouth, visible bones in the mouth Pain or trouble swallowing Severe bone, joint, or muscle pain Stomach bleeding--bloody or black, tar-like stools, vomiting blood or brown material that looks like coffee grounds Side effects that usually do not require medical attention (report to your care team if they continue or are bothersome): Constipation Diarrhea Nausea Stomach pain This list may not describe all possible side effects. Call your doctor for medical advice about side effects. You may report side effects to FDA at 1-800-FDA-1088. Where should I keep my medication? Keep out of the reach of children and pets. Store at room temperature between 15 and 30 degrees C (59 and 86 degrees F). Throw away any unused medication after the expiration date. NOTE: This sheet is a summary. It may not cover all possible information. If you have questions about this medicine, talk to your doctor, pharmacist, or health care provider.  2024 Elsevier/Gold Standard (2020-11-24 00:00:00)   EXERCISE AND DIET:  We recommended that you start or continue a regular exercise program for good health. Regular exercise means any activity that makes your heart beat faster and makes you sweat.  We recommend exercising at least 30 minutes per day at least 3 days a week, preferably 4 or  5.  We also recommend a diet low in fat and sugar.  Inactivity, poor dietary choices and obesity can cause diabetes, heart attack, stroke, and kidney damage, among others.    ALCOHOL AND SMOKING:  Women should limit their alcohol intake to no more than 7 drinks/beers/glasses of wine (combined, not each!) per week. Moderation of alcohol intake to this level decreases your risk of breast cancer and liver damage. And of course, no recreational drugs are part of a healthy lifestyle.  And absolutely no smoking or even second hand smoke. Most people know smoking can cause heart and lung diseases, but did you know it also contributes to weakening of your bones? Aging of your skin?  Yellowing of your teeth and nails?  CALCIUM AND VITAMIN D:  Adequate intake of calcium and Vitamin D are recommended.  The recommendations for exact amounts of these supplements seem to change often, but generally speaking 600 mg of calcium (either carbonate or citrate) and 800 units of Vitamin D per day seems prudent. Certain women may benefit from higher intake of Vitamin D.  If you are among these women, your doctor will have told you during your visit.    PAP SMEARS:  Pap smears, to check for cervical cancer or precancers,  have traditionally been done yearly, although recent scientific advances have shown that most women can have pap smears less often.  However, every woman still should have a physical exam from her gynecologist every year. It will include a breast check, inspection of the vulva and vagina to check for abnormal growths or skin changes, a visual exam of the cervix, and then an exam to evaluate the size and shape of the uterus and ovaries.  And after 71 years of age, a rectal exam is indicated to check for rectal cancers. We will also provide age appropriate advice regarding health maintenance, like when you should have certain vaccines, screening for sexually transmitted diseases, bone density testing, colonoscopy,  mammograms, etc.   MAMMOGRAMS:  All women over 72 years old should have a yearly mammogram. Many facilities now offer a "3D" mammogram, which may cost around $50 extra out of pocket. If possible,  we recommend you accept the option to have the 3D mammogram performed.  It both reduces the number of women who will be called back for extra views which then turn out to be normal, and it is better than the routine mammogram at detecting truly abnormal areas.    COLONOSCOPY:  Colonoscopy to screen for colon cancer is recommended for all women at age 29.  We know, you hate the idea of the prep.  We agree, BUT, having colon cancer and not knowing it is worse!!  Colon cancer so often starts as a polyp that can be seen and removed at colonscopy, which can quite literally save your life!  And if your first colonoscopy is normal and you have no family history of colon cancer, most women don't have to have it again for 10 years.  Once every ten years, you can do something that may end up saving your life, right?  We will be happy to help you get it scheduled when you are ready.  Be sure to check your insurance coverage so you understand how much it will cost.  It may be covered as a preventative service at no cost, but you should check your particular policy.

## 2023-10-04 ENCOUNTER — Other Ambulatory Visit (INDEPENDENT_AMBULATORY_CARE_PROVIDER_SITE_OTHER): Payer: Medicare Other | Admitting: Pharmacist

## 2023-10-04 DIAGNOSIS — M810 Age-related osteoporosis without current pathological fracture: Secondary | ICD-10-CM

## 2023-10-04 MED ORDER — ALENDRONATE SODIUM 70 MG PO TABS: 70.0000 mg | ORAL_TABLET | ORAL | 5 refills | Status: AC

## 2023-10-04 NOTE — Progress Notes (Signed)
10/04/2023 Name: Catherine Jensen MRN: [id number] DOB: 1951/01/19  Chief Complaint  Patient presents with   Osteoporosis    Catherine Jensen is a 72 y.o. year old female who presented for a telephone visit.  I connected with  Catherine Jensen on 10/04/23 by telephone and verified that I am speaking with the correct person using two identifiers.  I discussed the limitations of evaluation and management by telemedicine. The patient expressed understanding and agreed to proceed.  Patient was located in her home and PharmD in PCP office during this visit.   They were referred to the pharmacist by their PCP for assistance in managing  osteoporosis . Her latest DEXA revealed new onset osteoporosis, but only in the forearm.  All other areas remain osteopenic.  She denis family history of osteoporosis.   Subjective:  Care Team: Primary Care Provider: Junie Spencer, FNP   Medication Access/Adherence  Current Pharmacy:  [phone] - [place] - [address] [place] [address] Phone: [phone] Fax: [phone]  [id number] - [place] [place] - [address] [place] [address] Phone: [phone] Fax: [phone]   Osteoporosis:  Current medications: n/a Medications tried in the past: n/a  Current supplements: taken off of vitamin D and calcium due to elevated levels; gets plent in the diet  Current physical activity: walks, golfs--very active  Most recent DEXA: 08/29/23 Exclusions: L3 and L4.   COMPARISON:  12/22/2020.   FINDINGS: Scan quality: Good.   LUMBAR SPINE (L1-L2): BMD (in g/cm2): 0.929 T-score: -2.0 Z-score: 0.5   Rate of change from previous exam: No significant rate of change from previous exam.   LEFT FEMORAL NECK: BMD (in g/cm2): 0.722 T-score: -2.3 Z-score: 0.0   LEFT TOTAL HIP:  BMD (in g/cm2): 0.734  T-score: -2.2 Z-score: -0.1   RIGHT FEMORAL NECK: BMD (in g/cm2):  0.766  T-score: -2.0  Z-score: 0.3   RIGHT TOTAL HIP: BMD (in g/cm2): 0.726 T-score: -2.2 Z-score: -0.1   Dual femur rate of change from previous exam: No significant rate of change from previous exam.   LEFT FOREARM (RADIUS 33%):   BMD (in g/cm2): 0.603 T-score: -3.2 Z-score: -1.3   FRAX 10-YEAR PROBABILITY OF FRACTURE:   FRAX not reported as the lowest BMD is not in the osteopenia range.   Current medication access support: [employer] medicare   Objective:  No results found for: "HGBA1C"  Lab Results  Component Value Date   CREATININE 0.76 08/26/2023   BUN 13 08/26/2023   NA 141 08/26/2023   K 4.6 08/26/2023   CL 100 08/26/2023   CO2 25 08/26/2023    Lab Results  Component Value Date   CHOL 221 (H) 08/26/2023   HDL 77 08/26/2023   LDLCALC 126 (H) 08/26/2023   TRIG 105 08/26/2023   CHOLHDL 2.9 08/26/2023    Medications Reviewed Today     Reviewed by Danella Maiers, [facility] (Pharmacist) on 10/04/23 at 1440  Med List Status: <None>   Medication Order Taking? Sig Documenting Provider Last Dose Status Informant  albuterol (VENTOLIN HFA) 108 (90 Base) MCG/ACT inhaler [id number]  Inhale 2 puffs into the lungs every 6 (six) hours as needed for wheezing or shortness of breath. Delynn Flavin M, DO  Active   alendronate (FOSAMAX) 70 MG tablet [id number]  Take 1 tablet (70 mg total) by mouth every 7 (seven) days. Take with a full glass of water on an empty  stomach. Junie Spencer, FNP  Active   Ascorbic Acid (VITA-C PO) [id number]  Take 1 tablet by mouth every other day. [provider]  Active Self           Med Note Alphonzo Dublin   Tue Jun 12, 2022  1:18 PM)    BIOTIN PO [id number]  Take 1 tablet by mouth daily. [provider]  Active Self  DENTAGEL 1.1 % GEL dental gel [id number]  Place 1 Application onto teeth at bedtime. [provider]  Active Self  diphenhydrAMINE (BENADRYL) 25 MG tablet [id number]  Take 25 mg by mouth every 6 (six) hours  as needed for allergies. [provider]  Active Self  Lysine 1000 MG TABS [id number]  Take 1 tablet by mouth daily. [provider]  Active Self  Multiple Vitamin (MULTIVITAMIN) capsule [id number]  Take 1 capsule by mouth daily. [provider]  Active   rosuvastatin (CRESTOR) 5 MG tablet [id number] No Take 1 tablet (5 mg total) by mouth daily.  Patient not taking: Reported on 10/04/2023   Junie Spencer, FNP Not Taking Active             Assessment/Plan:   Osteoporosis: - Currently appropriately managed --starting alendronate (Fosamax) lowest T-score is -3.2 in forearm; no fractures - Reviewed recommendation for dietary calcium/vitamin D (no additional supplements--just dietary intake - Reviewed benefits of weight bearing exercise - Patient would like to hold on statin -- chang dietary habits and recheck lipids in 6-12 months   Follow Up Plan: PCP   Kieth Brightly, PharmD, BCACP, CPP Clinical Pharmacist, [facility]

## 2023-11-25 ENCOUNTER — Ambulatory Visit: Payer: Medicare Other

## 2023-11-25 VITALS — Ht 60.0 in | Wt 93.0 lb

## 2023-11-25 DIAGNOSIS — Z0000 Encounter for general adult medical examination without abnormal findings: Secondary | ICD-10-CM | POA: Diagnosis not present

## 2023-11-25 NOTE — Progress Notes (Signed)
Subjective:   Catherine Jensen is a 72 y.o. female who presents for Medicare Annual (Subsequent) preventive examination.  Visit Complete: Virtual I connected with  [name] on 11/25/23 by a audio enabled telemedicine application and verified that I am speaking with the correct person using two identifiers.  Patient Location: Home  Provider Location: Home Office  I discussed the limitations of evaluation and management by telemedicine. The patient expressed understanding and agreed to proceed.  Vital Signs: Because this visit was a virtual/telehealth visit, some criteria may be missing or patient reported. Any vitals not documented were not able to be obtained and vitals that have been documented are patient reported.  Cardiac Risk Factors include: advanced age (>96men, >57 women)     Objective:    Today's Vitals   11/25/23 1540  Weight: 93 lb (42.2 kg)  Height: 5' (1.524 m)   Body mass index is 18.16 kg/m.     11/25/2023    3:51 PM 11/23/2022    9:04 AM 07/05/2022    1:33 PM 06/18/2022   11:12 AM 03/14/2022    8:12 AM 11/22/2021    8:21 AM 02/10/2021   11:34 AM  Advanced Directives  Does Patient Have a Medical Advance Directive? No Yes No No No No No  Type of Furniture conservator/restorer;Living will       Copy of Healthcare Power of Attorney in Chart?  No - copy requested       Would patient like information on creating a medical advance directive? Yes (MAU/Ambulatory/Procedural Areas - Information given)   No - Patient declined  No - Patient declined No - Patient declined    Current Medications (verified) Outpatient Encounter Medications as of 11/25/2023  Medication Sig   albuterol (VENTOLIN HFA) 108 (90 Base) MCG/ACT inhaler Inhale 2 puffs into the lungs every 6 (six) hours as needed for wheezing or shortness of breath.   alendronate (FOSAMAX) 70 MG tablet Take 1 tablet (70 mg total) by mouth every 7 (seven) days. Take with a full glass of  water on an empty stomach.   Ascorbic Acid (VITA-C PO) Take 1 tablet by mouth every other day.   BIOTIN PO Take 1 tablet by mouth daily.   DENTAGEL 1.1 % GEL dental gel Place 1 Application onto teeth at bedtime.   diphenhydrAMINE (BENADRYL) 25 MG tablet Take 25 mg by mouth every 6 (six) hours as needed for allergies.   Lysine 1000 MG TABS Take 1 tablet by mouth daily.   Multiple Vitamin (MULTIVITAMIN) capsule Take 1 capsule by mouth daily.   rosuvastatin (CRESTOR) 5 MG tablet Take 1 tablet (5 mg total) by mouth daily. (Patient not taking: Reported on 11/25/2023)   No facility-administered encounter medications on file as of 11/25/2023.    Allergies (verified) Bee venom, Codeine, Musk oil fragrance, Other, Shellfish allergy, and Walnuts [nuts]   History: Past Medical History:  Diagnosis Date   COPD (chronic obstructive pulmonary disease) (HCC)    Endometriosis    FH: lung cancer    H/O blood clots    in legs   History of COVID-19 08/25/2021   History of ulcer disease    Multiple allergies    pt states she took allergy shots x 20 yrs   Tobacco abuse    Past Surgical History:  Procedure Laterality Date   ABLATION ON ENDOMETRIOSIS     LUMBAR LAMINECTOMY/DECOMPRESSION MICRODISCECTOMY Left 06/21/2022   Procedure: LUMBAR FIVE TO SACRAL ONE DECOMPRESSION;  Surgeon: Venita Lick, MD;  Location: [facility] OR;  Service: Orthopedics;  Laterality: Left;  2.5 hrs 3 C-Bed   SKIN LESION EXCISION     TOTAL ABDOMINAL HYSTERECTOMY  02/1990   TUBAL LIGATION     TUMOR REMOVAL     uterus   Family History  Problem Relation Age of Onset   Heart attack Brother    Diabetes Brother    Kidney failure Brother        56   Lung cancer Sister        w/ mets to brain   Asthma Sister    Crohn's disease Sister    Cancer Sister        lung cancer   Heart disease Mother    Diabetes Mother    Social History   Socioeconomic History   Marital status: Married    Spouse name: Quintin Alto   Number of  children: 1   Years of education: 12   Highest education level: 12th grade  Occupational History   Occupation: Theatre stage manager: [employer]    Comment: exposed to [employer] @ work  Tobacco Use   Smoking status: Former    Current packs/day: 0.00    Average packs/day: 0.5 packs/day for 21.0 years (10.5 ttl pk-yrs)    Types: Cigarettes    Start date: 11/27/1987    Quit date: 11/26/2008    Years since quitting: 15.0   Smokeless tobacco: Never  Vaping Use   Vaping status: Never Used  Substance and Sexual Activity   Alcohol use: No   Drug use: No   Sexual activity: Not Currently    Partners: Male    Birth control/protection: Surgical    Comment: hysterectomy; less than 5, after 17, no STD, no abnormal pap, no DES exposure  Other Topics Concern   Not on file  Social History Narrative   One bio child - 2 step children   Still very active and independent   Enjoys playing golf   Social Drivers of Corporate investment banker Strain: Low Risk  (11/25/2023)   Overall Financial Resource Strain (CARDIA)    Difficulty of Paying Living Expenses: Not hard at all  Food Insecurity: No Food Insecurity (11/25/2023)   Hunger Vital Sign    Worried About Running Out of Food in the Last Year: Never true    Ran Out of Food in the Last Year: Never true  Transportation Needs: No Transportation Needs (11/25/2023)   PRAPARE - Administrator, Civil Service (Medical): No    Lack of Transportation (Non-Medical): No  Physical Activity: Sufficiently Active (11/25/2023)   Exercise Vital Sign    Days of Exercise per Week: 5 days    Minutes of Exercise per Session: 30 min  Stress: No Stress Concern Present (11/25/2023)   [employer] of Occupational Health - Occupational Stress Questionnaire    Feeling of Stress : Not at all  Social Connections: Moderately Integrated (11/25/2023)   Social Connection and Isolation Panel [NHANES]    Frequency of Communication with Friends and Family:  More than three times a week    Frequency of Social Gatherings with Friends and Family: Three times a week    Attends Religious Services: 1 to 4 times per year    Active Member of Clubs or Organizations: No    Attends Banker Meetings: Never    Marital Status: Married    Tobacco Counseling Counseling given: Not Answered   Clinical  Intake:  Pre-visit preparation completed: Yes  Pain : No/denies pain     Diabetes: No  How often do you need to have someone help you when you read instructions, pamphlets, or other written materials from your doctor or pharmacy?: 1 - Never  Interpreter Needed?: No  Information entered by :: Kandis Fantasia LPN   Activities of Daily Living    11/25/2023    3:50 PM  In your present state of health, do you have any difficulty performing the following activities:  Hearing? 0  Vision? 0  Difficulty concentrating or making decisions? 0  Walking or climbing stairs? 0  Dressing or bathing? 0  Doing errands, shopping? 0  Preparing Food and eating ? N  Using the Toilet? N  In the past six months, have you accidently leaked urine? N  Do you have problems with loss of bowel control? N  Managing your Medications? N  Managing your Finances? N  Housekeeping or managing your Housekeeping? N    Patient Care Team: Junie Spencer, FNP as PCP - General (Family Medicine) Nicholas Lose, Billey Chang, MD (Inactive) as Referring Physician (Obstetrics and Gynecology)  Indicate any recent Medical Services you may have received from other than [facility] providers in the past year (date may be approximate).     Assessment:   This is a routine wellness examination for [facility].  Hearing/Vision screen Hearing Screening - Comments:: Denies hearing difficulties   Vision Screening - Comments:: Wears rx glasses - up to date with routine eye exams with [facility]    Goals Addressed             This Visit's Progress    Remain active and independent         Depression Screen    11/25/2023    3:46 PM 04/19/2023   10:36 AM 11/23/2022    9:03 AM 08/23/2022    8:08 AM 05/28/2022    2:44 PM 11/22/2021    8:19 AM 08/14/2021    9:30 AM  PHQ 2/9 Scores  PHQ - 2 Score 0 0 0 0 0 0 0  PHQ- 9 Score  0     0    Fall Risk    11/25/2023    3:50 PM 04/19/2023   10:36 AM 11/23/2022    9:01 AM 11/22/2022   10:25 AM 08/23/2022    8:08 AM  Fall Risk   Falls in the past year? 0 0 0 0 0  Number falls in past yr: 0  0 0   Injury with Fall? 0  0    Risk for fall due to : No Fall Risks  No Fall Risks    Follow up Falls prevention discussed;Education provided;Falls evaluation completed  Falls prevention discussed      MEDICARE RISK AT HOME: Medicare Risk at Home Any stairs in or around the home?: No If so, are there any without handrails?: No Home free of loose throw rugs in walkways, pet beds, electrical cords, etc?: Yes Adequate lighting in your home to reduce risk of falls?: Yes Life alert?: No Use of a cane, walker or w/c?: No Grab bars in the bathroom?: Yes Shower chair or bench in shower?: No Elevated toilet seat or a handicapped toilet?: Yes  TIMED UP AND GO:  Was the test performed?  No    Cognitive Function:    07/09/2018    9:47 AM 01/09/2017   10:42 AM  MMSE - Mini Mental State Exam  Orientation to  time 5 5  Orientation to Place 5 5  Registration 3 3  Attention/ Calculation 5 5  Recall 1 3  Language- name 2 objects 2 2  Language- repeat 1 1  Language- follow 3 step command 3 3  Language- read & follow direction 1 1  Write a sentence 1 1  Copy design 1 1  Total score 28 30        11/25/2023    3:51 PM 11/23/2022    9:04 AM 11/21/2020    8:33 AM 11/18/2019    9:40 AM  6CIT Screen  What Year? 0 points 0 points 0 points 0 points  What month? 0 points 0 points 0 points 0 points  What time? 0 points 0 points 0 points 0 points  Count back from 20 0 points 0 points 0 points 0 points  Months in reverse 0 points 0  points 2 points 2 points  Repeat phrase 0 points 0 points 0 points 0 points  Total Score 0 points 0 points 2 points 2 points    Immunizations Immunization History  Administered Date(s) Administered   Fluad Quad(high Dose 65+) 09/01/2020, 09/12/2021, 08/23/2022   Fluad Trivalent(High Dose 65+) 08/26/2023   Influenza, High Dose Seasonal PF 09/29/2018   Influenza,inj,Quad PF,6+ Mos 09/14/2013, 01/09/2017, 09/04/2017, 10/14/2019   Moderna Sars-Covid-2 Vaccination 12/23/2019, 01/20/2020, 11/07/2020   Pneumococcal Conjugate-13 01/09/2017   Pneumococcal Polysaccharide-23 05/23/2011, 07/24/2018   Pneumococcal-Unspecified 04/08/2013   Tdap 04/18/2016   Zoster Recombinant(Shingrix) 12/01/2021, 02/07/2022    TDAP status: Up to date  Flu Vaccine status: Up to date  Pneumococcal vaccine status: Up to date  Covid-19 vaccine status: Information provided on how to obtain vaccines.   Qualifies for Shingles Vaccine? Yes   Zostavax completed No   Shingrix Completed?: Yes  Screening Tests Health Maintenance  Topic Date Due   COVID-19 Vaccine (4 - 2024-25 season) 07/28/2023   MAMMOGRAM  11/21/2024   Medicare Annual Wellness (AWV)  11/24/2024   Fecal DNA (Cologuard)  01/01/2025   DEXA SCAN  08/27/2025   DTaP/Tdap/Td (2 - Td or Tdap) 04/18/2026   Pneumonia Vaccine 62+ Years old  Completed   INFLUENZA VACCINE  Completed   Hepatitis C Screening  Completed   Zoster Vaccines- Shingrix  Completed   HPV VACCINES  Aged Out    Health Maintenance  Health Maintenance Due  Topic Date Due   COVID-19 Vaccine (4 - 2024-25 season) 07/28/2023    Colorectal cancer screening: Type of screening: Cologuard. Completed 01/01/22. Repeat every 3 years  Mammogram status: Ordered and scheduled for 11/26/23. Pt provided with contact info and advised to call to schedule appt.   Bone Density status: Completed 08/28/23. Results reflect: Bone density results: OSTEOPOROSIS. Repeat every 2 years.  Lung Cancer  Screening: (Low Dose CT Chest recommended if Age 46-80 years, 20 pack-year currently smoking OR have quit w/in 15years.) does not qualify.   Lung Cancer Screening Referral: n/a  Additional Screening:  Hepatitis C Screening: does qualify; Completed 04/18/16  Vision Screening: Recommended annual ophthalmology exams for early detection of glaucoma and other disorders of the eye. Is the patient up to date with their annual eye exam?  Yes  Who is the provider or what is the name of the office in which the patient attends annual eye exams? [facility] If pt is not established with a provider, would they like to be referred to a provider to establish care? No .   Dental Screening: Recommended annual dental exams  for proper oral hygiene  Community Resource Referral / Chronic Care Management: CRR required this visit?  No   CCM required this visit?  No     Plan:     I have personally reviewed and noted the following in the patient's chart:   Medical and social history Use of alcohol, tobacco or illicit drugs  Current medications and supplements including opioid prescriptions. Patient is not currently taking opioid prescriptions. Functional ability and status Nutritional status Physical activity Advanced directives List of other physicians Hospitalizations, surgeries, and ER visits in previous 12 months Vitals Screenings to include cognitive, depression, and falls Referrals and appointments  In addition, I have reviewed and discussed with patient certain preventive protocols, quality metrics, and best practice recommendations. A written personalized care plan for preventive services as well as general preventive health recommendations were provided to patient.     Kandis Fantasia [place], [place]   [id number]   After Visit Summary: (MyChart) Due to this being a telephonic visit, the after visit summary with patients personalized plan was offered to patient via MyChart   Nurse  Notes: No concerns at this time

## 2023-11-25 NOTE — Patient Instructions (Signed)
Catherine Jensen , Thank you for taking time to come for your Medicare Wellness Visit. I appreciate your ongoing commitment to your health goals. Please review the following plan we discussed and let me know if I can assist you in the future.   Referrals/Orders/Follow-Ups/Clinician Recommendations: Aim for 30 minutes of exercise or brisk walking, 6-8 glasses of water, and 5 servings of fruits and vegetables each day.  This is a list of the screening recommended for you and due dates:  Health Maintenance  Topic Date Due   COVID-19 Vaccine (4 - 2024-25 season) 07/28/2023   Mammogram  11/21/2024   Medicare Annual Wellness Visit  11/24/2024   Cologuard (Stool DNA test)  01/01/2025   DEXA scan (bone density measurement)  08/27/2025   DTaP/Tdap/Td vaccine (2 - Td or Tdap) 04/18/2026   Pneumonia Vaccine  Completed   Flu Shot  Completed   Hepatitis C Screening  Completed   Zoster (Shingles) Vaccine  Completed   HPV Vaccine  Aged Out    Advanced directives: (ACP Link)Information on Advanced Care Planning can be found at [facility] [place] Advance Health Care Directives Advance Health Care Directives ([email or web address]   Next Medicare Annual Wellness Visit scheduled for next year: Yes

## 2023-11-26 ENCOUNTER — Ambulatory Visit
Admission: RE | Admit: 2023-11-26 | Discharge: 2023-11-26 | Disposition: A | Discharge status: Home / Self Care | Payer: Medicare Other | Source: Ambulatory Visit | Attending: Family | Admitting: Family

## 2023-11-26 DIAGNOSIS — Z1231 Encounter for screening mammogram for malignant neoplasm of breast: Secondary | ICD-10-CM

## 2024-01-23 DIAGNOSIS — H04123 Dry eye syndrome of bilateral lacrimal glands: Secondary | ICD-10-CM | POA: Diagnosis not present

## 2024-01-23 DIAGNOSIS — H25813 Combined forms of age-related cataract, bilateral: Secondary | ICD-10-CM | POA: Diagnosis not present

## 2024-01-23 DIAGNOSIS — H11043 Peripheral pterygium, stationary, bilateral: Secondary | ICD-10-CM | POA: Diagnosis not present

## 2024-01-23 DIAGNOSIS — H43392 Other vitreous opacities, left eye: Secondary | ICD-10-CM | POA: Diagnosis not present

## 2024-03-09 DIAGNOSIS — I781 Nevus, non-neoplastic: Secondary | ICD-10-CM | POA: Diagnosis not present

## 2024-03-09 DIAGNOSIS — Z85828 Personal history of other malignant neoplasm of skin: Secondary | ICD-10-CM | POA: Diagnosis not present

## 2024-03-09 DIAGNOSIS — L570 Actinic keratosis: Secondary | ICD-10-CM | POA: Diagnosis not present

## 2024-03-09 DIAGNOSIS — L814 Other melanin hyperpigmentation: Secondary | ICD-10-CM | POA: Diagnosis not present

## 2024-07-29 ENCOUNTER — Encounter: Payer: Self-pay | Admitting: *Deleted

## 2024-09-07 ENCOUNTER — Encounter: Payer: Medicare Other | Admitting: Family

## 2024-09-14 DIAGNOSIS — L573 Poikiloderma of Civatte: Secondary | ICD-10-CM | POA: Diagnosis not present

## 2024-09-14 DIAGNOSIS — L570 Actinic keratosis: Secondary | ICD-10-CM | POA: Diagnosis not present

## 2024-09-14 DIAGNOSIS — L814 Other melanin hyperpigmentation: Secondary | ICD-10-CM | POA: Diagnosis not present

## 2024-09-14 DIAGNOSIS — Z85828 Personal history of other malignant neoplasm of skin: Secondary | ICD-10-CM | POA: Diagnosis not present

## 2024-09-18 ENCOUNTER — Ambulatory Visit

## 2024-09-21 ENCOUNTER — Encounter: Payer: Self-pay | Admitting: Family

## 2024-09-21 ENCOUNTER — Ambulatory Visit (INDEPENDENT_AMBULATORY_CARE_PROVIDER_SITE_OTHER): Payer: Self-pay | Admitting: Family

## 2024-09-21 VITALS — BP 107/84 | HR 102 | Temp 96.7°F | Ht 60.0 in | Wt 87.4 lb

## 2024-09-21 DIAGNOSIS — Z23 Encounter for immunization: Secondary | ICD-10-CM

## 2024-09-21 DIAGNOSIS — M810 Age-related osteoporosis without current pathological fracture: Secondary | ICD-10-CM

## 2024-09-21 DIAGNOSIS — Z0001 Encounter for general adult medical examination with abnormal findings: Secondary | ICD-10-CM | POA: Diagnosis not present

## 2024-09-21 DIAGNOSIS — J449 Chronic obstructive pulmonary disease, unspecified: Secondary | ICD-10-CM

## 2024-09-21 DIAGNOSIS — Z0000 Encounter for general adult medical examination without abnormal findings: Secondary | ICD-10-CM

## 2024-09-21 DIAGNOSIS — E785 Hyperlipidemia, unspecified: Secondary | ICD-10-CM | POA: Insufficient documentation

## 2024-09-21 LAB — LIPID PANEL

## 2024-09-21 NOTE — Patient Instructions (Signed)
 Weak, Fragile Bones (Osteoporosis): What to Know  Osteoporosis is when the bones become thin and less dense than normal. Osteoporosis makes bones more fragile and more likely to break. Over time, the condition can cause your bones to become so weak that they break, or fracture, after a minor fall. Bones in the hip, wrist, and spine are most likely to break. What are the causes? The exact cause of this condition is not known. What increases the risk? Having family members with this condition. Taking: Steroid medicines. Anti-seizure medicines. Being female. Being 72 years old or older. Being of [place] decent. Smoking or using other products that contain nicotine or tobacco. Not exercising. What are the signs or symptoms? A broken bone might be the first sign, especially if the break results from a fall or injury that usually would not cause a bone to break. Other signs and symptoms include: Pain in the neck or low back. Being hunched over. Getting shorter. How is this diagnosed? This condition may be diagnosed based on: Your medical history. A physical exam. A bone mineral density test, also called a DXA or DEXA test. This test uses X-rays to measure how dense your bones are. How is this treated? This condition may be treated with medicines and supplements, including: Taking medicines to slow bone loss or help make the bones stronger. Taking calcium and vitamin D supplements every day. Taking hormone replacement medicines, such as estrogen for female and testosterone for males. It may also be treated by: Quitting smoking or using tobacco products. Doing exercises. Limiting how much alcohol you drink. Eating more foods with calcium and vitamin D in them. Monitoring your blood levels of calcium and vitamin D. The goal of treatment is to strengthen your bones and lower your risk for a bone break. Follow these instructions at home: Eating and drinking Eat plenty of food with  calcium and vitamin D in them. This may include: Some fish, such as salmon and tuna. Foods that have calcium and vitamin D added to them, such as some cereals. Egg yolks. Cheese. Liver.  Activity Exercise as told. Ask what things are safe for you to do. You may be told to: Do exercises that make your muscles work to hold your body weight up, such as tai chi, yoga, or walking. These are called weight-bearing exercises. Do exercises to make your muscles stronger, such as lifting weights. These are called muscle-strengthening exercises. Lifestyle Do not drink alcohol if your health care provider tells you not to drink. Your health care provider tells you not to drink. You are pregnant, may be pregnant, or are planning to become pregnant. If you drink alcohol: Limit how much you have to: 0-1 drink a day if you're female. 0-2 drinks a day if you're female. Know how much alcohol is in your drink. In the [place] one drink is one 12 oz bottle of beer (355 mL), one 5 oz glass of wine (148 mL), or one 1 oz glass of hard liquor (44 mL). Do not smoke, vape, or use nicotine or tobacco. Preventing falls Use a cane, walker, scooter, or crutches to help you move around if needed. Keep rooms well-lit and get rid of clutter. Put away things on the floor that could make you trip, such as cords and rugs. Put grab bars in bathrooms and safety rails on stairs. Use rubber mats in slippery areas, like bathrooms. Wear shoes that: Fit you well. Support your feet. Have closed toes. Have rubber soles or low  heels. Talk to your provider about all of the medicines you take. Some medicines can make you more likely to fall because they can cause dizziness or changes in blood pressure. General instructions Take your medicines only as told. Keep all follow-up visits. Your provider may want to repeat tests. Where to find more information Bone Health and Osteoporosis Foundation: bonehealthandosteoporosis.org Contact  a health care provider if: You have never been screened for osteoporosis and you are: A female who is 16 years old or older. A female who is age 28 years old or older. Get help right away if: You fall. You get hurt. This information is not intended to replace advice given to you by your health care provider. Make sure you discuss any questions you have with your health care provider. Document Revised: 09/24/2023 Document Reviewed: 05/31/2023 Elsevier Patient Education  2024 [employer].

## 2024-09-21 NOTE — Progress Notes (Signed)
 Subjective:    Patient ID: Catherine Jensen, female    DOB: November 23, 1951, 73 y.o.   MRN: [id number]  Chief Complaint  Patient presents with   Medical Management of Chronic Issues   Annual Exam   Pt presents to the office today for CPE and chronic follow up. Pt denies any headache, palpitations, SOB, or edema at this time.    She had back surgery on 06/21/22 and has been doing well. Reports her pain is 0 out 10.    She has COPD, but does not take any inhalers. She quit smoking about 2014 years. Reports she has SOB when walking up hills. She is walking 1 miles a day and plays golf three times a week.    Her last lab work showed hypercalcemia, but was cleared by Hematologists. Had a negative CT chest.   She has osteopenia, but has stopped her calcium  and vit D.  Takes Fosamax  weekly. Her last dexascan 08/26/23.   Hyperlipidemia This is a chronic problem. The current episode started more than 1 year ago. The problem is uncontrolled. Recent lipid tests were reviewed and are high. Current antihyperlipidemic treatment includes diet change. The current treatment provides mild improvement of lipids. Risk factors for coronary artery disease include dyslipidemia and post-menopausal.      Review of Systems  All other systems reviewed and are negative.      Family History  Problem Relation Age of Onset   Heart attack Brother    Diabetes Brother    Kidney failure Brother        29   Lung cancer Sister        w/ mets to brain   Asthma Sister    Crohn's disease Sister    Cancer Sister        lung cancer   Heart disease Mother    Diabetes Mother    Social History   Socioeconomic History   Marital status: Married    Spouse name: Thais   Number of children: 1   Years of education: 12   Highest education level: 12th grade  Occupational History   Occupation: Theatre Stage Manager: [employer]    Comment: exposed to [employer] @ work  Tobacco Use   Smoking status: Former     Current packs/day: 0.00    Average packs/day: 0.5 packs/day for 21.0 years (10.5 ttl pk-yrs)    Types: Cigarettes    Start date: 11/27/1987    Quit date: 11/26/2008    Years since quitting: 15.8   Smokeless tobacco: Never  Vaping Use   Vaping status: Never Used  Substance and Sexual Activity   Alcohol use: No   Drug use: No   Sexual activity: Not Currently    Partners: Male    Birth control/protection: Surgical    Comment: hysterectomy; less than 5, after 17, no STD, no abnormal pap, no DES exposure  Other Topics Concern   Not on file  Social History Narrative   One bio child - 2 step children   Still very active and independent   Enjoys playing golf   Social Drivers of Corporate Investment Banker Strain: Low Risk  (11/25/2023)   Overall Financial Resource Strain (CARDIA)    Difficulty of Paying Living Expenses: Not hard at all  Food Insecurity: No Food Insecurity (11/25/2023)   Hunger Vital Sign    Worried About Running Out of Food in the Last Year: Never true    Ran Out  of Food in the Last Year: Never true  Transportation Needs: No Transportation Needs (11/25/2023)   PRAPARE - Administrator, Civil Service (Medical): No    Lack of Transportation (Non-Medical): No  Physical Activity: Sufficiently Active (11/25/2023)   Exercise Vital Sign    Days of Exercise per Week: 5 days    Minutes of Exercise per Session: 30 min  Stress: No Stress Concern Present (11/25/2023)   [employer] of Occupational Health - Occupational Stress Questionnaire    Feeling of Stress : Not at all  Social Connections: Moderately Integrated (11/25/2023)   Social Connection and Isolation Panel    Frequency of Communication with Friends and Family: More than three times a week    Frequency of Social Gatherings with Friends and Family: Three times a week    Attends Religious Services: 1 to 4 times per year    Active Member of Clubs or Organizations: No    Attends Banker  Meetings: Never    Marital Status: Married    Objective:   Physical Exam Vitals reviewed.  Constitutional:      General: She is not in acute distress.    Appearance: She is well-developed.  HENT:     Head: Normocephalic and atraumatic.     Right Ear: Tympanic membrane normal.     Left Ear: Tympanic membrane normal.  Eyes:     Pupils: Pupils are equal, round, and reactive to light.  Neck:     Thyroid : No thyromegaly.  Cardiovascular:     Rate and Rhythm: Normal rate and regular rhythm.     Heart sounds: Normal heart sounds. No murmur heard. Pulmonary:     Effort: Pulmonary effort is normal. No respiratory distress.     Breath sounds: Normal breath sounds. No wheezing.  Abdominal:     General: Bowel sounds are normal. There is no distension.     Palpations: Abdomen is soft.     Tenderness: There is no abdominal tenderness.  Musculoskeletal:        General: No tenderness. Normal range of motion.     Cervical back: Normal range of motion and neck supple.  Skin:    General: Skin is warm and dry.  Neurological:     Mental Status: She is alert and oriented to person, place, and time.     Cranial Nerves: No cranial nerve deficit.     Deep Tendon Reflexes: Reflexes are normal and symmetric.  Psychiatric:        Behavior: Behavior normal.        Thought Content: Thought content normal.        Judgment: Judgment normal.       BP 107/84   Pulse (!) 102   Temp (!) 96.7 F (35.9 C) (Temporal)   Ht 5' (1.524 m)   Wt 87 lb 6.4 oz (39.6 kg)   LMP 02/24/1990 Comment: [facility] does pap,[name] CNM  SpO2 98%   BMI 17.07 kg/m      Assessment & Plan:  Catherine Jensen comes in today with chief complaint of Medical Management of Chronic Issues and Annual Exam   Diagnosis and orders addressed:  1. Encounter for immunization - Flu vaccine HIGH DOSE PF(Fluzone Trivalent)  2. Chronic obstructive pulmonary disease, unspecified COPD type (HCC) - CMP14+EGFR -  CBC with Differential/Platelet  3. Hypercalcemia - CMP14+EGFR - CBC with Differential/Platelet - VITAMIN D  25 Hydroxy (Vit-D Deficiency, Fractures)  4. Age-related osteoporosis without current pathological fracture -  CMP14+EGFR - CBC with Differential/Platelet - VITAMIN D  25 Hydroxy (Vit-D Deficiency, Fractures)  5. Hyperlipidemia, unspecified hyperlipidemia type  - CMP14+EGFR - CBC with Differential/Platelet - Lipid panel  6. Annual physical exam (Primary) - CMP14+EGFR - CBC with Differential/Platelet - Lipid panel - VITAMIN D  25 Hydroxy (Vit-D Deficiency, Fractures)   Labs pending Continue current medications  Health Maintenance reviewed Diet and exercise encouraged  Follow up plan: 1 year   Bari Learn, FNP

## 2024-09-22 ENCOUNTER — Ambulatory Visit: Payer: Self-pay | Admitting: Family

## 2024-09-22 ENCOUNTER — Other Ambulatory Visit: Payer: Self-pay | Admitting: Family

## 2024-09-22 LAB — CBC WITH DIFFERENTIAL/PLATELET
Basophils Absolute: 0.1 x10E3/uL (ref 0.0–0.2)
Basos: 2 %
EOS (ABSOLUTE): 0.2 x10E3/uL (ref 0.0–0.4)
Eos: 4 %
Hematocrit: 43.6 % (ref 34.0–46.6)
Hemoglobin: 14.7 g/dL (ref 11.1–15.9)
Immature Grans (Abs): 0 x10E3/uL (ref 0.0–0.1)
Immature Granulocytes: 0 %
Lymphocytes Absolute: 1.2 x10E3/uL (ref 0.7–3.1)
Lymphs: 29 %
MCH: 32.2 pg (ref 26.6–33.0)
MCHC: 33.7 g/dL (ref 31.5–35.7)
MCV: 96 fL (ref 79–97)
Monocytes Absolute: 0.3 x10E3/uL (ref 0.1–0.9)
Monocytes: 8 %
Neutrophils Absolute: 2.4 x10E3/uL (ref 1.4–7.0)
Neutrophils: 57 %
Platelets: 249 x10E3/uL (ref 150–450)
RBC: 4.56 x10E6/uL (ref 3.77–5.28)
RDW: 11.2 % — ABNORMAL LOW (ref 11.7–15.4)
WBC: 4.2 x10E3/uL (ref 3.4–10.8)

## 2024-09-22 LAB — LIPID PANEL
Cholesterol, Total: 219 mg/dL — AB (ref 100–199)
HDL: 92 mg/dL (ref >39)
LDL CALC COMMENT:: 2.4 ratio (ref 0.0–4.4)
LDL Chol Calc (NIH): 116 mg/dL — AB (ref 0–99)
Triglycerides: 62 mg/dL (ref 0–149)
VLDL Cholesterol Cal: 11 mg/dL (ref 5–40)

## 2024-09-22 LAB — CMP14+EGFR
ALT: 16 IU/L (ref 0–32)
AST: 21 IU/L (ref 0–40)
Albumin: 4.7 g/dL (ref 3.8–4.8)
Alkaline Phosphatase: 74 IU/L (ref 49–135)
BUN/Creatinine Ratio: 18 (ref 12–28)
BUN: 14 mg/dL (ref 8–27)
Bilirubin Total: 0.6 mg/dL (ref 0.0–1.2)
CO2: 26 mmol/L (ref 20–29)
Calcium: 10.2 mg/dL (ref 8.7–10.3)
Chloride: 104 mmol/L (ref 96–106)
Creatinine, Ser: 0.77 mg/dL (ref 0.57–1.00)
Globulin, Total: 1.9 g/dL (ref 1.5–4.5)
Glucose: 103 mg/dL — AB (ref 70–99)
Potassium: 5.1 mmol/L (ref 3.5–5.2)
Sodium: 142 mmol/L (ref 134–144)
Total Protein: 6.6 g/dL (ref 6.0–8.5)
eGFR: 82 mL/min/1.73 (ref >59)

## 2024-09-22 LAB — VITAMIN D 25 HYDROXY (VIT D DEFICIENCY, FRACTURES): Vit D, 25-Hydroxy: 84.5 ng/mL (ref 30.0–100.0)

## 2024-09-22 MED ORDER — ROSUVASTATIN CALCIUM 5 MG PO TABS: 5.0000 mg | ORAL_TABLET | Freq: Every day | ORAL | 3 refills | Status: AC

## 2024-09-25 ENCOUNTER — Ambulatory Visit: Payer: Self-pay

## 2024-09-25 NOTE — Telephone Encounter (Signed)
 FYI Only or Action Required?: FYI only for provider: Patient advised to call Poison Control-took her husband's BP meds on accident today. Was given Poison Control number to follow up with.  Patient was last seen in primary care on 09/21/2024 by Lavell Bari LABOR, FNP.  Called Nurse Triage reporting Medication Problem.  Symptoms began today.  Interventions attempted: Rest, hydration, or home remedies.  Symptoms are: unchanged.  Triage Disposition: Call [facility]  Patient/caregiver understands and will follow disposition?: Yes  Copied from CRM [id number]. Topic: Clinical - Red Word Triage >> Sep 25, 2024  8:19 AM Alfonso ORN wrote: Red Word that prompted transfer to Nurse Triage: patient took spouse medication this morning by mistake the metoprolol succinate (TOPROL-XL) 100 MG 24 hr tablet and lisinopril (ZESTRIL) 20 MG tablet mouth is dry and ears are tingling Reason for Disposition  Took another person's prescription drug  Answer Assessment - Initial Assessment Questions 1. NAME of MEDICINE: What medicine(s) are you calling about?     Metoprolol Succinate XL and lisinopril 20 mg 2. QUESTION: What is your question? (e.g., double dose of medicine, side effect)     Patient accidentally took her husband's blood pressure medication 3. PRESCRIBER: Who prescribed the medicine? Reason: if prescribed by specialist, call should be referred to that group.     Husband's doctor.  4. SYMPTOMS: Do you have any symptoms? If Yes, ask: What symptoms are you having?  How bad are the symptoms (e.g., mild, moderate, severe)     Dry mouth-doesn't fell well.  Protocols used: Medication Question Call-A-AH

## 2024-09-28 NOTE — Telephone Encounter (Signed)
 Called patient, doing fine today. Discussed importance of keeping medication separated.

## 2024-12-03 ENCOUNTER — Ambulatory Visit: Payer: Self-pay

## 2024-12-10 ENCOUNTER — Other Ambulatory Visit: Payer: Self-pay | Admitting: Family

## 2024-12-10 DIAGNOSIS — Z1231 Encounter for screening mammogram for malignant neoplasm of breast: Secondary | ICD-10-CM

## 2024-12-10 DIAGNOSIS — M810 Age-related osteoporosis without current pathological fracture: Secondary | ICD-10-CM

## 2025-01-06 ENCOUNTER — Ambulatory Visit

## 2025-01-20 ENCOUNTER — Ambulatory Visit

## 2025-09-27 ENCOUNTER — Ambulatory Visit: Payer: Self-pay | Admitting: Family
